# Patient Record
Sex: Female | Born: 1980 | Race: White | Hispanic: No | Marital: Married | Smoking: Never smoker
Health system: Southern US, Community
[De-identification: ages and names within clinical notes are randomized; demographics above are authoritative.]

## PROBLEM LIST (undated history)

## (undated) DIAGNOSIS — K219 Gastro-esophageal reflux disease without esophagitis: Secondary | ICD-10-CM

## (undated) DIAGNOSIS — E079 Disorder of thyroid, unspecified: Secondary | ICD-10-CM

## (undated) DIAGNOSIS — F419 Anxiety disorder, unspecified: Secondary | ICD-10-CM

## (undated) DIAGNOSIS — T7840XA Allergy, unspecified, initial encounter: Secondary | ICD-10-CM

## (undated) DIAGNOSIS — K589 Irritable bowel syndrome without diarrhea: Secondary | ICD-10-CM

## (undated) HISTORY — DX: Allergy, unspecified, initial encounter: T78.40XA

## (undated) HISTORY — PX: LASIK: SHX215

## (undated) HISTORY — DX: Disorder of thyroid, unspecified: E07.9

## (undated) HISTORY — PX: OTHER SURGICAL HISTORY: SHX169

## (undated) HISTORY — PX: ABLATION: SHX5711

## (undated) HISTORY — DX: Anxiety disorder, unspecified: F41.9

## (undated) HISTORY — PX: MOUTH SURGERY: SHX715

## (undated) HISTORY — PX: WISDOM TOOTH EXTRACTION: SHX21

## (undated) HISTORY — DX: Gastro-esophageal reflux disease without esophagitis: K21.9

---

## 2011-08-01 ENCOUNTER — Ambulatory Visit (INDEPENDENT_AMBULATORY_CARE_PROVIDER_SITE_OTHER): Payer: Self-pay

## 2011-08-01 DIAGNOSIS — R197 Diarrhea, unspecified: Secondary | ICD-10-CM

## 2011-08-15 ENCOUNTER — Encounter: Payer: Self-pay | Admitting: Gastroenterology

## 2011-08-15 ENCOUNTER — Ambulatory Visit (AMBULATORY_SURGERY_CENTER): Payer: Self-pay | Admitting: Gastroenterology

## 2011-08-15 VITALS — BP 131/65 | HR 85 | Temp 99.6°F | Resp 15 | Ht 64.0 in | Wt 204.0 lb

## 2011-08-15 DIAGNOSIS — K589 Irritable bowel syndrome without diarrhea: Secondary | ICD-10-CM

## 2011-08-15 DIAGNOSIS — Z1211 Encounter for screening for malignant neoplasm of colon: Secondary | ICD-10-CM

## 2011-08-15 MED ORDER — SODIUM CHLORIDE 0.9 % IV SOLN: 500.0000 mL | INTRAVENOUS | Status: DC

## 2011-08-15 NOTE — Patient Instructions (Signed)
See your discharge instructions.  If you have any questions or comments, please give [place] a call at [phone].  Thank you for choosing [place] for your medical needs.

## 2011-08-19 ENCOUNTER — Telehealth: Payer: Self-pay | Admitting: *Deleted

## 2011-08-19 NOTE — Telephone Encounter (Signed)

## 2011-08-25 ENCOUNTER — Ambulatory Visit (INDEPENDENT_AMBULATORY_CARE_PROVIDER_SITE_OTHER): Payer: Self-pay

## 2011-08-25 DIAGNOSIS — R197 Diarrhea, unspecified: Secondary | ICD-10-CM

## 2012-02-29 ENCOUNTER — Encounter (HOSPITAL_BASED_OUTPATIENT_CLINIC_OR_DEPARTMENT_OTHER): Payer: Self-pay | Admitting: *Deleted

## 2012-02-29 ENCOUNTER — Emergency Department (INDEPENDENT_AMBULATORY_CARE_PROVIDER_SITE_OTHER): Payer: BC Managed Care – PPO

## 2012-02-29 ENCOUNTER — Emergency Department (HOSPITAL_BASED_OUTPATIENT_CLINIC_OR_DEPARTMENT_OTHER)
Admission: EM | Admit: 2012-02-29 | Discharge: 2012-02-29 | Disposition: A | Discharge status: Home / Self Care | Payer: BC Managed Care – PPO | Attending: Emergency Medicine | Admitting: Emergency Medicine

## 2012-02-29 DIAGNOSIS — N390 Urinary tract infection, site not specified: Secondary | ICD-10-CM | POA: Insufficient documentation

## 2012-02-29 DIAGNOSIS — R000 Tachycardia, unspecified: Secondary | ICD-10-CM | POA: Insufficient documentation

## 2012-02-29 DIAGNOSIS — R109 Unspecified abdominal pain: Secondary | ICD-10-CM | POA: Insufficient documentation

## 2012-02-29 DIAGNOSIS — R110 Nausea: Secondary | ICD-10-CM | POA: Insufficient documentation

## 2012-02-29 DIAGNOSIS — R6883 Chills (without fever): Secondary | ICD-10-CM | POA: Insufficient documentation

## 2012-02-29 DIAGNOSIS — R350 Frequency of micturition: Secondary | ICD-10-CM | POA: Insufficient documentation

## 2012-02-29 DIAGNOSIS — R10813 Right lower quadrant abdominal tenderness: Secondary | ICD-10-CM | POA: Insufficient documentation

## 2012-02-29 DIAGNOSIS — R918 Other nonspecific abnormal finding of lung field: Secondary | ICD-10-CM

## 2012-02-29 HISTORY — DX: Irritable bowel syndrome, unspecified: K58.9

## 2012-02-29 LAB — URINE MICROSCOPIC-ADD ON

## 2012-02-29 LAB — COMPREHENSIVE METABOLIC PANEL
BUN: 15 mg/dL (ref 6–23)
CO2: 25 mEq/L (ref 19–32)
Chloride: 101 mEq/L (ref 96–112)
Creatinine, Ser: 0.9 mg/dL (ref 0.50–1.10)
GFR calc Af Amer: >90 mL/min (ref >90)
GFR calc non Af Amer: 84 mL/min — ABNORMAL LOW (ref >90)
Glucose, Bld: 115 mg/dL — ABNORMAL HIGH (ref 70–99)
Total Bilirubin: 0.3 mg/dL (ref 0.3–1.2)

## 2012-02-29 LAB — CBC
HCT: 35.6 % — ABNORMAL LOW (ref 36.0–46.0)
Hemoglobin: 12.2 g/dL (ref 12.0–15.0)
MCHC: 34.3 g/dL (ref 30.0–36.0)

## 2012-02-29 LAB — LIPASE, BLOOD: Lipase: 26 U/L (ref 11–59)

## 2012-02-29 LAB — URINALYSIS, ROUTINE W REFLEX MICROSCOPIC
Glucose, UA: NEGATIVE mg/dL
Ketones, ur: NEGATIVE mg/dL
Nitrite: NEGATIVE
Protein, ur: 30 mg/dL — AB

## 2012-02-29 LAB — DIFFERENTIAL
Basophils Relative: 0 % (ref 0–1)
Monocytes Absolute: 2.1 10*3/uL — ABNORMAL HIGH (ref 0.1–1.0)
Monocytes Relative: 10 % (ref 3–12)
Neutro Abs: 16.5 10*3/uL — ABNORMAL HIGH (ref 1.7–7.7)

## 2012-02-29 MED ORDER — SODIUM CHLORIDE 0.9 % IV BOLUS (SEPSIS)
1000.0000 mL | Freq: Once | INTRAVENOUS | Status: AC
  Administered 2012-02-29: 1000 mL via INTRAVENOUS

## 2012-02-29 MED ORDER — CEPHALEXIN 500 MG PO CAPS: 500.0000 mg | ORAL_CAPSULE | Freq: Four times a day (QID) | ORAL | Status: AC

## 2012-02-29 MED ORDER — MORPHINE SULFATE 4 MG/ML IJ SOLN
4.0000 mg | Freq: Once | INTRAMUSCULAR | Status: AC
  Administered 2012-02-29: 4 mg via INTRAVENOUS
  Filled 2012-02-29: qty 1

## 2012-02-29 MED ORDER — IOHEXOL 300 MG/ML  SOLN
40.0000 mL | Freq: Once | INTRAMUSCULAR | Status: AC | PRN
  Administered 2012-02-29: 40 mL via ORAL

## 2012-02-29 MED ORDER — IOHEXOL 300 MG/ML  SOLN
100.0000 mL | Freq: Once | INTRAMUSCULAR | Status: AC | PRN
  Administered 2012-02-29: 100 mL via INTRAVENOUS

## 2012-02-29 MED ORDER — ONDANSETRON HCL 4 MG/2ML IJ SOLN
4.0000 mg | Freq: Once | INTRAMUSCULAR | Status: AC
  Administered 2012-02-29: 4 mg via INTRAVENOUS
  Filled 2012-02-29: qty 2

## 2012-02-29 MED ORDER — ACETAMINOPHEN 325 MG PO TABS
650.0000 mg | ORAL_TABLET | Freq: Once | ORAL | Status: AC
  Administered 2012-02-29: 650 mg via ORAL
  Filled 2012-02-29: qty 2

## 2012-02-29 NOTE — ED Notes (Signed)
Pt with painful urination and lower abd pain, nausea

## 2012-02-29 NOTE — ED Provider Notes (Signed)
History     CSN: [id number]  Arrival date & time 02/29/12  4098   First MD Initiated Contact with Patient 02/29/12 0207      Chief Complaint  Patient presents with  . Abdominal Pain  . painful urination     (Consider location/radiation/quality/duration/timing/severity/associated sxs/prior treatment) HPI Patient is a 31 year old female who presents today complaining of acute onset of right lower quadrant abdominal pain as well as increased urinary frequency this afternoon. Patient had some nausea but no vomiting. She has felt hot and cold but knows of no objective fevers. She denies any recent chest pain, cough, or shortness of breath. She denies any constipation or diarrhea. She thought at first that her pain might be gas but it is not moving it has not gone away. The pain is an 8/10. Patient denies any dysuria or vaginal discharge. She has no history of urinary tract infections. Patient does have a history of IBS but says this feels very different.There are no other associated or modifying factors.  Past Medical History  Diagnosis Date  . Anxiety   . Allergy     seasonal allergies  . Irritable bowel     Past Surgical History  Procedure Date  . Lasik   . Partial detatched retina   . Mouth surgery   . Wisdom tooth extraction     History reviewed. No pertinent family history.  History  Substance Use Topics  . Smoking status: Never Smoker   . Smokeless tobacco: Never Used  . Alcohol Use: Yes     occasionally    OB History    Grav Para Term Preterm Abortions TAB SAB Ect Mult Living                  Review of Systems  Constitutional: Positive for chills.  HENT: Negative.   Eyes: Negative.   Respiratory: Negative.   Cardiovascular: Negative.   Gastrointestinal: Positive for nausea and abdominal pain.  Genitourinary: Positive for frequency.  Musculoskeletal: Negative.   Neurological: Negative.   Hematological: Negative.   Psychiatric/Behavioral: Negative.   All  other systems reviewed and are negative.    Allergies  Hydrocodone  Home Medications   Current Outpatient Rx  Name Route Sig Dispense Refill  . CEPHALEXIN 500 MG PO CAPS Oral Take 1 capsule (500 mg total) by mouth 4 (four) times daily. 40 capsule 0  . CRYSELLE-28 PO Oral Take by mouth.      . TOPIRAMATE 25 MG PO CPSP Oral Take 25 mg by mouth 2 (two) times daily.      Marland Kitchen ZOLPIDEM TARTRATE ER 6.25 MG PO TBCR Oral Take 6.25 mg by mouth at bedtime as needed.        BP 101/54  Pulse 94  Temp(Src) 98.8 F (37.1 C) (Oral)  Resp 16  SpO2 99%  LMP 12/27/2011  Physical Exam  Nursing note and vitals reviewed. GEN: Well-developed, well-nourished female in no distress, uncomfortable HEENT: Atraumatic, normocephalic. Oropharynx clear without erythema EYES: PERRLA BL, no scleral icterus. NECK: Trachea midline, no meningismus CV: regular rate and rhythm. No murmurs, rubs, or gallops PULM: No respiratory distress.  No crackles, wheezes, or rales. GI: soft, tender to palpation in the right lower quadrant. No guarding, rebound. + bowel sounds  GU: deferred Neuro: cranial nerves 2-12 intact, no abnormalities of strength or sensation, A and O x 3 MSK: Patient moves all 4 extremities symmetrically, no deformity, edema, or injury noted Skin: No rashes petechiae, purpura, or jaundice Psych: no  abnormality of mood   ED Course  Procedures (including critical care time)  Labs Reviewed  URINALYSIS, ROUTINE W REFLEX MICROSCOPIC - Abnormal; Notable for the following:    APPearance CLOUDY (*)    Hgb urine dipstick LARGE (*)    Protein, ur 30 (*)    Leukocytes, UA LARGE (*)    All other components within normal limits  URINE MICROSCOPIC-ADD ON - Abnormal; Notable for the following:    Bacteria, UA MANY (*)    All other components within normal limits  CBC - Abnormal; Notable for the following:    WBC 20.2 (*)    HCT 35.6 (*)    All other components within normal limits  DIFFERENTIAL -  Abnormal; Notable for the following:    Neutrophils Relative 81 (*)    Neutro Abs 16.5 (*)    Lymphocytes Relative 8 (*)    Monocytes Absolute 2.1 (*)    All other components within normal limits  COMPREHENSIVE METABOLIC PANEL - Abnormal; Notable for the following:    Glucose, Bld 115 (*)    GFR calc non Af Amer 84 (*)    All other components within normal limits  PREGNANCY, URINE  LIPASE, BLOOD  URINE CULTURE   Ct Abdomen Pelvis W Contrast  02/29/2012  *RADIOLOGY REPORT*  Clinical Data: Lower abdominal pain and nausea.  CT ABDOMEN AND PELVIS WITH CONTRAST  Technique:  Multidetector CT imaging of the abdomen and pelvis was performed following the standard protocol during bolus administration of intravenous contrast.  Contrast: 100 mL of Omnipaque 300 IV contrast  Comparison: None.  Findings: Mild focal left basilar airspace opacity raises concern for mild pneumonia.  The liver and spleen are unremarkable in appearance.  The gallbladder is within normal limits.  The pancreas and adrenal glands are unremarkable.  The kidneys are unremarkable in appearance.  There is no evidence of hydronephrosis.  No renal or ureteral stones are seen.  No perinephric stranding is appreciated.  Mild stranding is noted along the course of the right ureter, with question of mild ureteral wall enhancement.  This could reflect mild right-sided ureteritis.  No definite pyelonephritis is characterized.  No free fluid is identified.  The small bowel is unremarkable in appearance.  The stomach is within normal limits.  No acute vascular abnormalities are seen.  The appendix is normal in caliber, without evidence for appendicitis.  The colon is partially filled with stool and is unremarkable in appearance.  The bladder is mildly distended and within normal limits.  The uterus is unremarkable in appearance.  The ovaries are relatively symmetric; no suspicious adnexal masses are seen.  Borderline prominent bilateral inguinal nodes  are noted, measuring up to 1.1 cm in short axis.  No acute osseous abnormalities are identified.  IMPRESSION:  1.  Mild inflammation along the course of the right ureter may reflect mild right-sided ureteritis; no definite evidence of pyelonephritis. 2.  Mild focal left basilar airspace opacity raises concern for mild pneumonia. 3.  Borderline prominent bilateral inguinal nodes, measuring up to 1.1 cm in short axis.  Original Report Authenticated By: Tonia Ghent, M.D.     1. UTI (urinary tract infection)       MDM  Patient was evaluated by myself. Based on presentation the patient had a urinalysis and urine pregnancy ordered prior to my valuation. Patient did have evidence of UTI on this. Her urine pregnancy test is negative. On physical exam patient did have right lower quadrant tenderness and she has  not had an appendectomy in the past. Laboratory workup for abdominal pain as well as a CT of abdomen and pelvis were performed. Patient had also been tachycardic and on my physical a temperature had been repeated and was 100.1. Patient was given Zofran, IV fluids, Tylenol, and Rocephin. A urine culture was sent. CT of the abdomen and pelvis did show inflammation of the ureter without frank pyelonephritis. No appendicitis was seen. Patient was feeling better after treatment. She will be discharged home in good condition with 10 days of Keflex. Patient is to followup with her regular doctor as needed. Patient declined any pain or nausea medications when these were offered.        Cyndra Numbers, MD 02/29/12 [phone]

## 2012-02-29 NOTE — Discharge Instructions (Signed)

## 2012-03-02 LAB — URINE CULTURE

## 2012-03-03 NOTE — ED Notes (Signed)
+   urine Treated per protocol MD; Sensitive to same 

## 2013-04-20 IMAGING — CT CT ABD-PELV W/ CM
2 of 4 series · 17 of 46 positions shown, 19 images · IV contrast (APPLIED)
Comparison: None.

CLINICAL DATA: Lower abdominal pain and nausea.

CT ABDOMEN AND PELVIS WITH CONTRAST
TECHNIQUE: Multidetector CT imaging of the abdomen and pelvis was
performed following the standard protocol during bolus
administration of intravenous contrast.
Contrast: 100 mL of Omnipaque 300 IV contrast

[Series 2: abd/pelvis 5.0 b31f · axial · 0.82mm/px · z∈[-445,-35]mm · 14 of 90 slices shown, 16 images]
[im 4/90  soft-tissue]
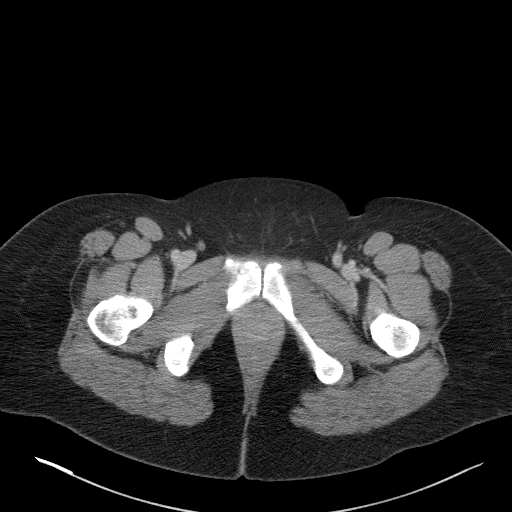
[im 4/90  bone]
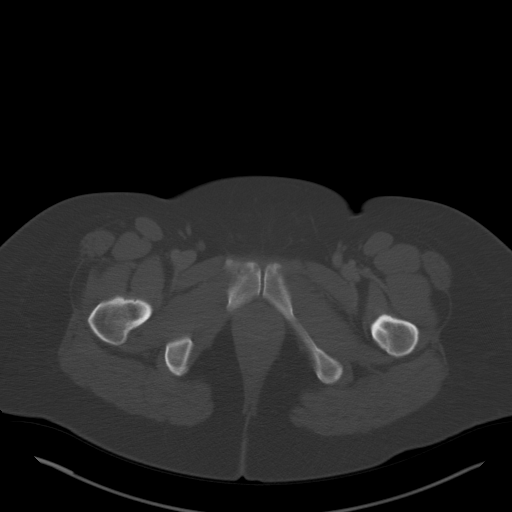
[im 11/90  soft-tissue]
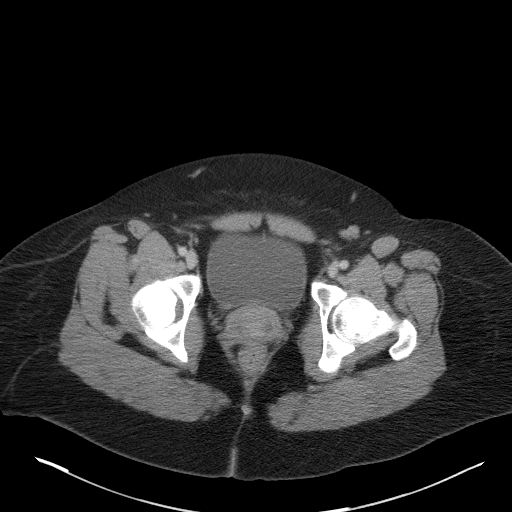
[im 18/90  soft-tissue]
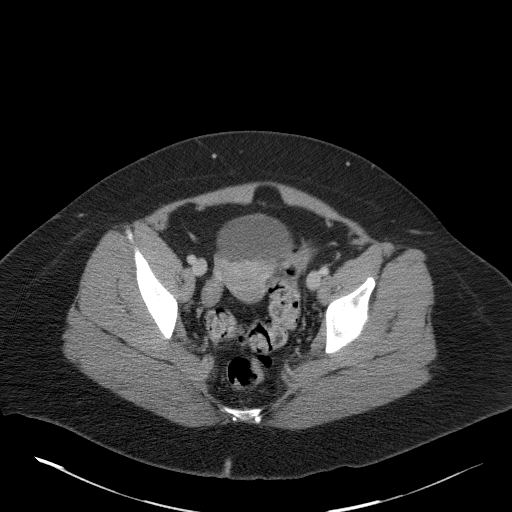
[im 25/90  soft-tissue]
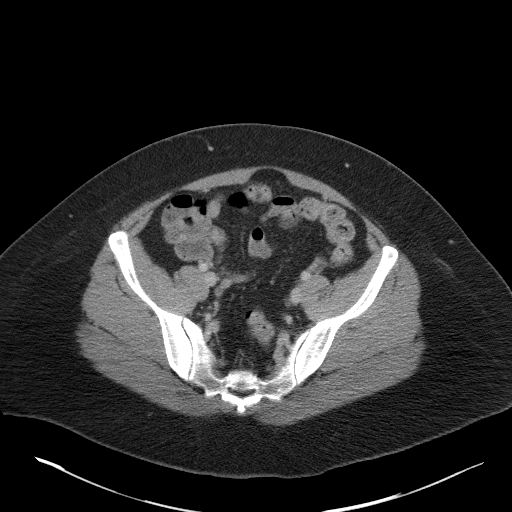
[im 29/90  soft-tissue]
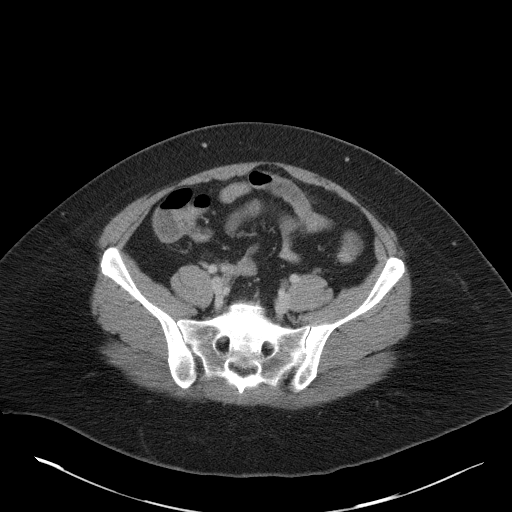
[im 36/90  soft-tissue]
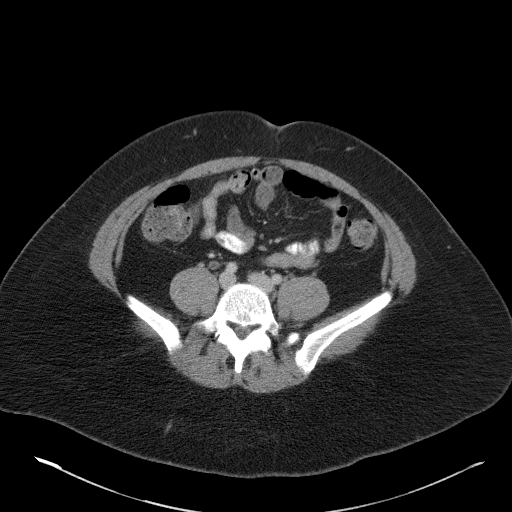
[im 43/90  soft-tissue]
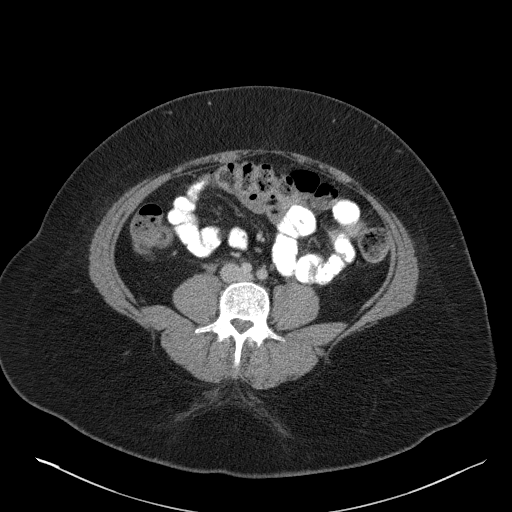
[im 47/90  soft-tissue]
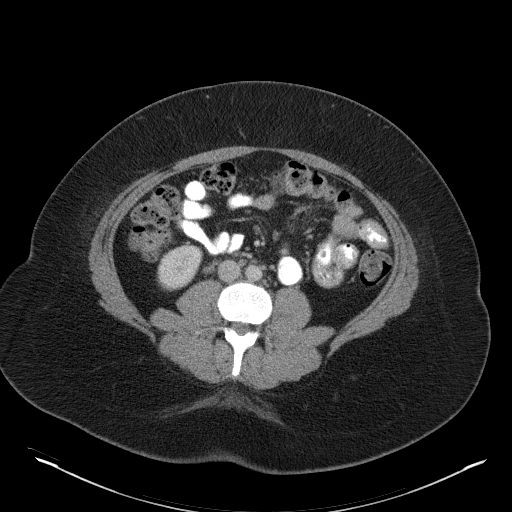
[im 54/90  soft-tissue]
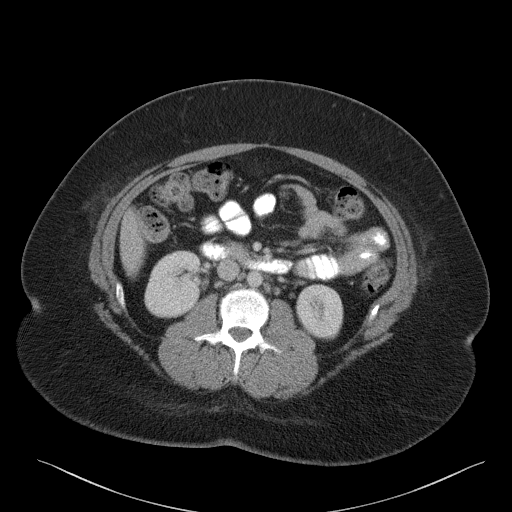
[im 54/90  bone]
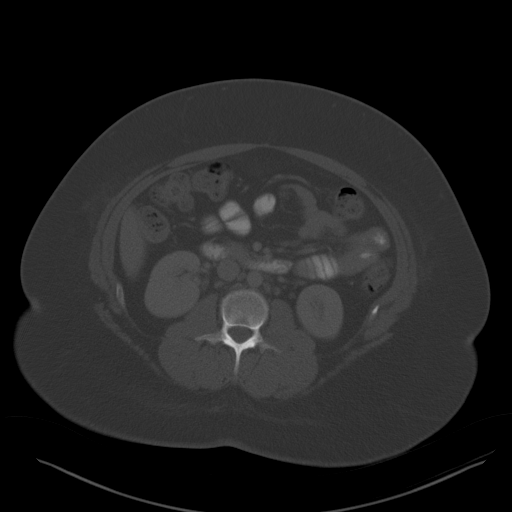
[im 61/90  soft-tissue]
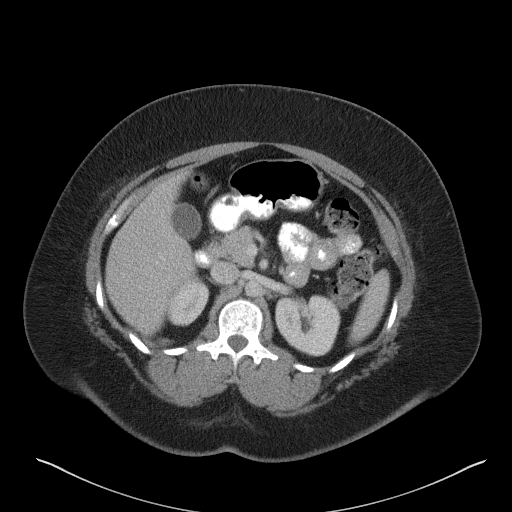
[im 68/90  soft-tissue]
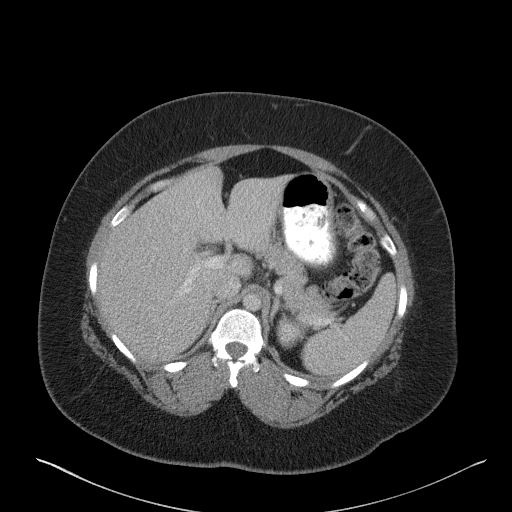
[im 72/90  soft-tissue]
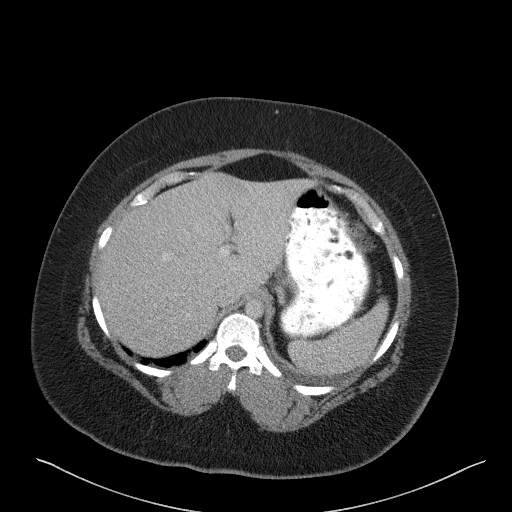
[im 79/90  soft-tissue]
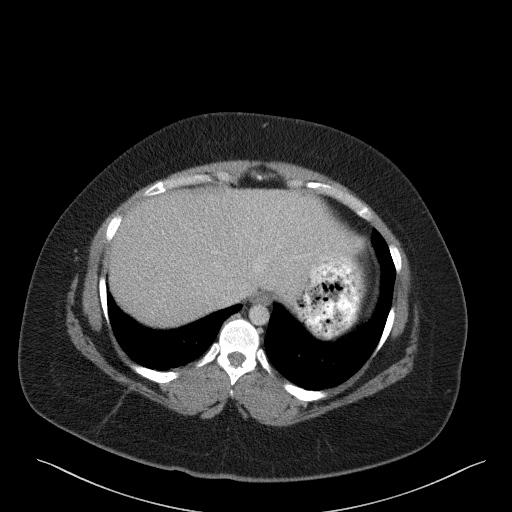
[im 86/90  soft-tissue]
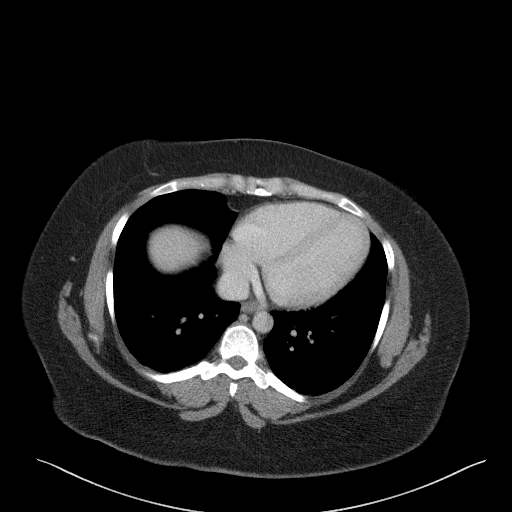

[Series 5: abd/pelvis 3.0 coronal · coronal · 0.99mm/px · 3 of 100 slices shown]
[im 34/100  soft-tissue]
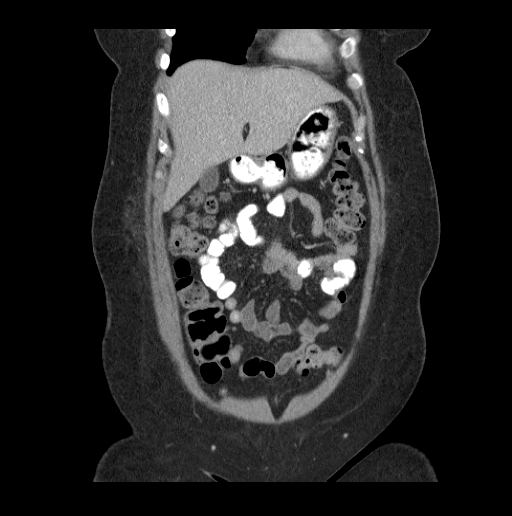
[im 45/100  soft-tissue]
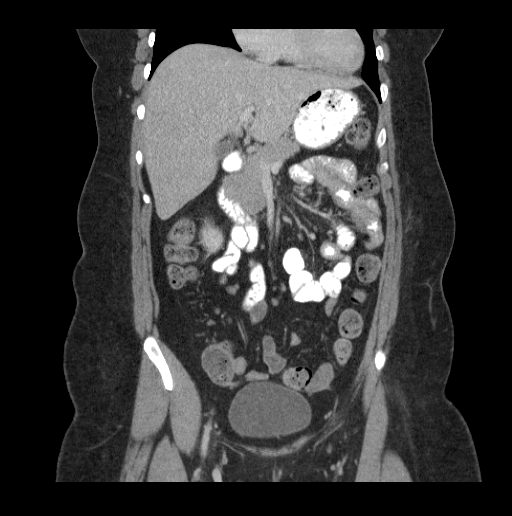
[im 56/100  soft-tissue]
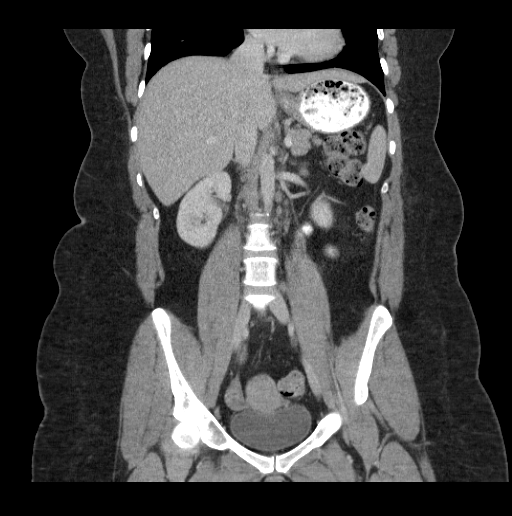

[17 of 46 positions shown; findings below may reference images not displayed]

FINDINGS: Mild focal left basilar airspace opacity raises concern
for mild pneumonia.

The liver and spleen are unremarkable in appearance.  The
gallbladder is within normal limits.  The pancreas and adrenal
glands are unremarkable.

The kidneys are unremarkable in appearance.  There is no evidence
of hydronephrosis.  No renal or ureteral stones are seen.  No
perinephric stranding is appreciated.

Mild stranding is noted along the course of the right ureter, with
question of mild ureteral wall enhancement.  This could reflect
mild right-sided ureteritis.  No definite pyelonephritis is
characterized.

No free fluid is identified.  The small bowel is unremarkable in
appearance.  The stomach is within normal limits.  No acute
vascular abnormalities are seen.

The appendix is normal in caliber, without evidence for
appendicitis.  The colon is partially filled with stool and is
unremarkable in appearance.

The bladder is mildly distended and within normal limits.  The
uterus is unremarkable in appearance.  The ovaries are relatively
symmetric; no suspicious adnexal masses are seen.  Borderline
prominent bilateral inguinal nodes are noted, measuring up to
cm in short axis.

No acute osseous abnormalities are identified.
IMPRESSION: 1.  Mild inflammation along the course of the right ureter may
reflect mild right-sided ureteritis; no definite evidence of
pyelonephritis.
2.  Mild focal left basilar airspace opacity raises concern for
mild pneumonia.
3.  Borderline prominent bilateral inguinal nodes, measuring up to
1.1 cm in short axis.

## 2016-09-18 DIAGNOSIS — G43009 Migraine without aura, not intractable, without status migrainosus: Secondary | ICD-10-CM | POA: Insufficient documentation

## 2018-04-14 DIAGNOSIS — G4700 Insomnia, unspecified: Secondary | ICD-10-CM | POA: Insufficient documentation

## 2020-06-14 DIAGNOSIS — F411 Generalized anxiety disorder: Secondary | ICD-10-CM | POA: Insufficient documentation

## 2021-09-30 ENCOUNTER — Emergency Department (HOSPITAL_BASED_OUTPATIENT_CLINIC_OR_DEPARTMENT_OTHER): Payer: No Typology Code available for payment source

## 2021-09-30 ENCOUNTER — Encounter (HOSPITAL_BASED_OUTPATIENT_CLINIC_OR_DEPARTMENT_OTHER): Payer: Self-pay

## 2021-09-30 ENCOUNTER — Other Ambulatory Visit: Payer: Self-pay

## 2021-09-30 ENCOUNTER — Emergency Department (HOSPITAL_BASED_OUTPATIENT_CLINIC_OR_DEPARTMENT_OTHER)
Admission: EM | Admit: 2021-09-30 | Discharge: 2021-09-30 | Disposition: A | Discharge status: Home / Self Care | Payer: No Typology Code available for payment source | Attending: Emergency Medicine | Admitting: Emergency Medicine

## 2021-09-30 DIAGNOSIS — R1013 Epigastric pain: Secondary | ICD-10-CM | POA: Insufficient documentation

## 2021-09-30 DIAGNOSIS — R1011 Right upper quadrant pain: Secondary | ICD-10-CM | POA: Diagnosis not present

## 2021-09-30 LAB — TROPONIN I (HIGH SENSITIVITY): Troponin I (High Sensitivity): <2 ng/L (ref <18)

## 2021-09-30 LAB — BASIC METABOLIC PANEL
Anion gap: 9 (ref 5–15)
BUN: 15 mg/dL (ref 6–20)
CO2: 27 mmol/L (ref 22–32)
Calcium: 9.5 mg/dL (ref 8.9–10.3)
Chloride: 99 mmol/L (ref 98–111)
Creatinine, Ser: 0.76 mg/dL (ref 0.44–1.00)
GFR, Estimated: >60 mL/min (ref >60)
Glucose, Bld: 111 mg/dL — ABNORMAL HIGH (ref 70–99)
Potassium: 3.7 mmol/L (ref 3.5–5.1)
Sodium: 135 mmol/L (ref 135–145)

## 2021-09-30 LAB — HEPATIC FUNCTION PANEL
ALT: 20 U/L (ref 0–44)
AST: 19 U/L (ref 15–41)
Albumin: 4 g/dL (ref 3.5–5.0)
Alkaline Phosphatase: 72 U/L (ref 38–126)
Bilirubin, Direct: <0.1 mg/dL (ref 0.0–0.2)
Total Bilirubin: 0.2 mg/dL — ABNORMAL LOW (ref 0.3–1.2)
Total Protein: 7.5 g/dL (ref 6.5–8.1)

## 2021-09-30 LAB — URINALYSIS, ROUTINE W REFLEX MICROSCOPIC
Bilirubin Urine: NEGATIVE
Glucose, UA: NEGATIVE mg/dL
Hgb urine dipstick: NEGATIVE
Ketones, ur: NEGATIVE mg/dL
Leukocytes,Ua: NEGATIVE
Nitrite: NEGATIVE
Protein, ur: NEGATIVE mg/dL
Specific Gravity, Urine: 1.015 (ref 1.005–1.030)
pH: 6.5 (ref 5.0–8.0)

## 2021-09-30 LAB — CBC
HCT: 39.4 % (ref 36.0–46.0)
Hemoglobin: 13.3 g/dL (ref 12.0–15.0)
MCH: 30.6 pg (ref 26.0–34.0)
MCHC: 33.8 g/dL (ref 30.0–36.0)
MCV: 90.6 fL (ref 80.0–100.0)
Platelets: 394 10*3/uL (ref 150–400)
RBC: 4.35 MIL/uL (ref 3.87–5.11)
RDW: 13.2 % (ref 11.5–15.5)
WBC: 10.9 10*3/uL — ABNORMAL HIGH (ref 4.0–10.5)
nRBC: 0 % (ref 0.0–0.2)

## 2021-09-30 LAB — LIPASE, BLOOD: Lipase: 25 U/L (ref 11–51)

## 2021-09-30 MED ORDER — PANTOPRAZOLE SODIUM 40 MG PO TBEC
40.0000 mg | DELAYED_RELEASE_TABLET | Freq: Once | ORAL | Status: AC
  Administered 2021-09-30: 40 mg via ORAL
  Filled 2021-09-30: qty 1

## 2021-09-30 MED ORDER — ALUM & MAG HYDROXIDE-SIMETH 200-200-20 MG/5ML PO SUSP
30.0000 mL | Freq: Once | ORAL | Status: AC
  Administered 2021-09-30: 30 mL via ORAL
  Filled 2021-09-30: qty 30

## 2021-09-30 MED ORDER — LIDOCAINE VISCOUS HCL 2 % MT SOLN
15.0000 mL | Freq: Once | OROMUCOSAL | Status: AC
  Administered 2021-09-30: 15 mL via ORAL
  Filled 2021-09-30: qty 15

## 2021-09-30 MED ORDER — PANTOPRAZOLE SODIUM 40 MG PO TBEC: 40.0000 mg | DELAYED_RELEASE_TABLET | Freq: Every day | ORAL | 0 refills | Status: DC

## 2021-09-30 MED ORDER — ACETAMINOPHEN 325 MG PO TABS
650.0000 mg | ORAL_TABLET | Freq: Once | ORAL | Status: AC
  Administered 2021-09-30: 650 mg via ORAL
  Filled 2021-09-30: qty 2

## 2021-09-30 NOTE — ED Triage Notes (Signed)
Pt started having indigestion like pain yesterday, pain has continued since. States burps or passes gas and improves for a second then returns.

## 2021-09-30 NOTE — Discharge Instructions (Signed)
Take Protonix as prescribed.  Recommend discontinue any NSAID medications (Goody powder, ibuprofen, naproxen). Follow-up with GI, call tomorrow to schedule an appointment.  Return to the emergency room for worsening or concerning symptoms.

## 2021-09-30 NOTE — ED Notes (Signed)
Pt placed in a gown and hooked up to the monitor with 5 lead, BP cuff and pulse ox 

## 2021-09-30 NOTE — ED Provider Notes (Signed)
[facility] EMERGENCY DEPARTMENT Provider Note   CSN: [id number] Arrival date & time: 09/30/21  1659     History Chief Complaint  Patient presents with   Chest Pain    Carrie Sparks is a 40 y.o. female.  40 year old female with no significant past medical history presents with complaint of epigastric abdominal pain onset yesterday described as a sharp and burning pain.  Pain improved somewhat with belching however resumes.  Patient is taking Tums without improvement in her symptoms.  Pain does not radiate.  Pain is worse with eating, has somewhat improved since she has gone several hours now without eating.  Reports taking a lot of ibuprofen and is under a lot of stress at work currently.  Prior abdominal surgery includes C-section.      Past Medical History:  Diagnosis Date   Allergy    seasonal allergies   Anxiety    Irritable bowel     There are no problems to display for this patient.   Past Surgical History:  Procedure Laterality Date   LASIK     MOUTH SURGERY     Partial detatched retina     WISDOM TOOTH EXTRACTION       OB History   No obstetric history on file.     History reviewed. No pertinent family history.  Social History   Tobacco Use   Smoking status: Never   Smokeless tobacco: Never  Substance Use Topics   Alcohol use: Yes    Comment: occasionally   Drug use: No    Home Medications Prior to Admission medications   Medication Sig Start Date End Date Taking? Authorizing Provider  pantoprazole (PROTONIX) 40 MG tablet Take 1 tablet (40 mg total) by mouth daily. 09/30/21 10/30/21 Yes Jeannie Fend, PA-C  Norgestrel-Ethinyl Estradiol (CRYSELLE-28 PO) Take by mouth.      [provider]  topiramate (TOPAMAX) 25 MG capsule Take 25 mg by mouth 2 (two) times daily.      [provider]  zolpidem (AMBIEN CR) 6.25 MG CR tablet Take 6.25 mg by mouth at bedtime as needed.      [provider]     Allergies    Hydrocodone  Review of Systems   Review of Systems  Constitutional:  Positive for chills. Negative for fever.  Respiratory:  Negative for shortness of breath.   Cardiovascular:  Negative for chest pain.  Gastrointestinal:  Positive for abdominal pain and nausea. Negative for blood in stool, constipation, diarrhea and vomiting.  Genitourinary:  Negative for dysuria.  Musculoskeletal:  Negative for back pain.  Skin:  Negative for rash and wound.  Allergic/Immunologic: Negative for immunocompromised state.  Neurological:  Negative for weakness.  Hematological:  Negative for adenopathy.  All other systems reviewed and are negative.  Physical Exam Updated Vital Signs BP 115/76 (BP Location: Right Arm)   Pulse 84   Temp 98.2 F (36.8 C) (Oral)   Resp 18   Ht 5\' 4"  (1.626 m)   Wt 99.8 kg   LMP 09/23/2021 (Approximate)   SpO2 97%   BMI 37.76 kg/m   Physical Exam Vitals and nursing note reviewed.  Constitutional:      General: She is not in acute distress.    Appearance: She is well-developed. She is not diaphoretic.  HENT:     Head: Normocephalic and atraumatic.  Cardiovascular:     Rate and Rhythm: Normal rate and regular rhythm.     Heart sounds: Normal heart  sounds. No murmur heard. Pulmonary:     Effort: Pulmonary effort is normal.     Breath sounds: Normal breath sounds.  Chest:     Chest wall: No tenderness.  Abdominal:     General: There is abdominal bruit.     Palpations: Abdomen is soft.     Tenderness: There is abdominal tenderness in the right upper quadrant, epigastric area and left upper quadrant. Negative signs include Massimiliano Rohleder's sign.  Skin:    General: Skin is warm and dry.     Findings: No erythema or rash.  Neurological:     Mental Status: She is alert and oriented to person, place, and time.  Psychiatric:        Behavior: Behavior normal.    ED Results / Procedures / Treatments   Labs (all labs ordered are listed, but only  abnormal results are displayed) Labs Reviewed  BASIC METABOLIC PANEL - Abnormal; Notable for the following components:      Result Value   Glucose, Bld 111 (*)    All other components within normal limits  CBC - Abnormal; Notable for the following components:   WBC 10.9 (*)    All other components within normal limits  HEPATIC FUNCTION PANEL - Abnormal; Notable for the following components:   Total Bilirubin 0.2 (*)    All other components within normal limits  URINALYSIS, ROUTINE W REFLEX MICROSCOPIC  LIPASE, BLOOD  TROPONIN I (HIGH SENSITIVITY)    EKG EKG Interpretation  Date/Time:  Monday September 30 2021 17:07:46 EDT Ventricular Rate:  96 PR Interval:  146 QRS Duration: 80 QT Interval:  364 QTC Calculation: 459 R Axis:   76 Text Interpretation: Normal sinus rhythm Normal ECG No previous ECGs available Confirmed by Alvira Monday ([id number]) on 09/30/2021 7:47:46 PM  Radiology DG Chest 2 View  Result Date: 09/30/2021 CLINICAL DATA:  Chest pain EXAM: CHEST - 2 VIEW COMPARISON:  11/24/2016 FINDINGS: Heart and mediastinal contours are within normal limits. No focal opacities or effusions. No acute bony abnormality. IMPRESSION: No active cardiopulmonary disease. Electronically Signed   By: Charlett Nose M.D.   On: 09/30/2021 17:32   US Abdomen Limited RUQ (LIVER/GB)  Result Date: 09/30/2021 CLINICAL DATA:  Right upper quadrant pain EXAM: ULTRASOUND ABDOMEN LIMITED RIGHT UPPER QUADRANT COMPARISON:  CT 02/29/2012 FINDINGS: Gallbladder: No gallstones or wall thickening visualized. No sonographic Toye Rouillard sign noted by sonographer. Common bile duct: Diameter: 4.8 mm Liver: No focal lesion identified. Within normal limits in parenchymal echogenicity. Portal vein is patent on color Doppler imaging with normal direction of blood flow towards the liver. Other: None. IMPRESSION: Negative examination Electronically Signed   By: Jasmine Pang M.D.   On: 09/30/2021 22:03     Procedures Procedures   Medications Ordered in ED Medications  acetaminophen (TYLENOL) tablet 650 mg (has no administration in time range)  pantoprazole (PROTONIX) EC tablet 40 mg (has no administration in time range)  alum & mag hydroxide-simeth (MAALOX/MYLANTA) 200-200-20 MG/5ML suspension 30 mL (has no administration in time range)    And  lidocaine (XYLOCAINE) 2 % viscous mouth solution 15 mL (has no administration in time range)    ED Course  I have reviewed the triage vital signs and the nursing notes.  Pertinent labs & imaging results that were available during my care of the patient were reviewed by me and considered in my medical decision making (see chart for details).  Clinical Course as of 09/30/21 2235  Mon Sep 30, 2021  2234  40 year old female presents with epigastric abdominal pain as above.  At time of exam, her pain has improved, she has mild tenderness in the epigastric and generalized upper abdomen.  Negative Zianne Schubring sign.  Patient was registered and has a chest pain complaint, EKG shows normal sinus rhythm, no ischemic changes.  Her troponin is less than 2.  Symptoms appear to be more related to upper abdomen, added on hepatic function which is unremarkable.  Lipase is normal.  CBC with mild acidosis of 10.9, nonspecific.  Urinalysis is unremarkable and basic metabolic is without significant findings.  Patient is mildly tachycardic on arrival however afebrile at 98.2 with room air O2 sats 95%.  By time of discharge, heart rate is normal at 84.  Right upper quadrant ultrasound does not show evidence of acute cholecystitis.  Chest x-ray is unremarkable. With history of NSAID use and increased stress, suspect patient may have peptic ulcer disease.  She is started on Protonix with referral to GI for further work-up with return to ER precautions. Advised to discontinue NSAIDs, given peptic ulcer diet. [LM]    Clinical Course User Index [LM] Alden Hipp   MDM  Rules/Calculators/A&P                            Final Clinical Impression(s) / ED Diagnoses Final diagnoses:  Right upper quadrant abdominal pain  Epigastric abdominal pain    Rx / DC Orders ED Discharge Orders          Ordered    pantoprazole (PROTONIX) 40 MG tablet  Daily        09/30/21 2229             Jeannie Fend, PA-C 09/30/21 2235    Alvira Monday, MD 10/02/21 2224

## 2022-01-15 LAB — HM PAP SMEAR: HM Pap smear: NORMAL

## 2022-06-17 ENCOUNTER — Emergency Department (HOSPITAL_BASED_OUTPATIENT_CLINIC_OR_DEPARTMENT_OTHER)
Admission: EM | Admit: 2022-06-17 | Discharge: 2022-06-17 | Disposition: A | Discharge status: Home / Self Care | Payer: Managed Care, Other (non HMO) | Attending: Emergency Medicine | Admitting: Emergency Medicine

## 2022-06-17 ENCOUNTER — Other Ambulatory Visit: Payer: Self-pay

## 2022-06-17 ENCOUNTER — Encounter (HOSPITAL_BASED_OUTPATIENT_CLINIC_OR_DEPARTMENT_OTHER): Payer: Self-pay | Admitting: Emergency Medicine

## 2022-06-17 ENCOUNTER — Emergency Department (HOSPITAL_BASED_OUTPATIENT_CLINIC_OR_DEPARTMENT_OTHER): Payer: Managed Care, Other (non HMO)

## 2022-06-17 DIAGNOSIS — R197 Diarrhea, unspecified: Secondary | ICD-10-CM | POA: Diagnosis not present

## 2022-06-17 DIAGNOSIS — R110 Nausea: Secondary | ICD-10-CM | POA: Diagnosis not present

## 2022-06-17 DIAGNOSIS — R1013 Epigastric pain: Secondary | ICD-10-CM | POA: Diagnosis present

## 2022-06-17 LAB — CBC WITH DIFFERENTIAL/PLATELET
Abs Immature Granulocytes: 0.12 10*3/uL — ABNORMAL HIGH (ref 0.00–0.07)
Basophils Absolute: 0.1 10*3/uL (ref 0.0–0.1)
Basophils Relative: 1 %
Eosinophils Absolute: 0.5 10*3/uL (ref 0.0–0.5)
Eosinophils Relative: 4 %
HCT: 36.7 % (ref 36.0–46.0)
Hemoglobin: 12.3 g/dL (ref 12.0–15.0)
Immature Granulocytes: 1 %
Lymphocytes Relative: 33 %
Lymphs Abs: 4 10*3/uL (ref 0.7–4.0)
MCH: 30.5 pg (ref 26.0–34.0)
MCHC: 33.5 g/dL (ref 30.0–36.0)
MCV: 91.1 fL (ref 80.0–100.0)
Monocytes Absolute: 0.9 10*3/uL (ref 0.1–1.0)
Monocytes Relative: 8 %
Neutro Abs: 6.5 10*3/uL (ref 1.7–7.7)
Neutrophils Relative %: 53 %
Platelets: 382 10*3/uL (ref 150–400)
RBC: 4.03 MIL/uL (ref 3.87–5.11)
RDW: 13.7 % (ref 11.5–15.5)
WBC: 12.1 10*3/uL — ABNORMAL HIGH (ref 4.0–10.5)
nRBC: 0 % (ref 0.0–0.2)

## 2022-06-17 LAB — COMPREHENSIVE METABOLIC PANEL
ALT: 13 U/L (ref 0–44)
AST: 15 U/L (ref 15–41)
Albumin: 3.6 g/dL (ref 3.5–5.0)
Alkaline Phosphatase: 75 U/L (ref 38–126)
Anion gap: 6 (ref 5–15)
BUN: 11 mg/dL (ref 6–20)
CO2: 26 mmol/L (ref 22–32)
Calcium: 8.7 mg/dL — ABNORMAL LOW (ref 8.9–10.3)
Chloride: 105 mmol/L (ref 98–111)
Creatinine, Ser: 0.82 mg/dL (ref 0.44–1.00)
GFR, Estimated: >60 mL/min (ref >60)
Glucose, Bld: 130 mg/dL — ABNORMAL HIGH (ref 70–99)
Potassium: 3.8 mmol/L (ref 3.5–5.1)
Sodium: 137 mmol/L (ref 135–145)
Total Bilirubin: 0.4 mg/dL (ref 0.3–1.2)
Total Protein: 7.4 g/dL (ref 6.5–8.1)

## 2022-06-17 LAB — URINALYSIS, ROUTINE W REFLEX MICROSCOPIC
Bilirubin Urine: NEGATIVE
Glucose, UA: NEGATIVE mg/dL
Hgb urine dipstick: NEGATIVE
Ketones, ur: NEGATIVE mg/dL
Leukocytes,Ua: NEGATIVE
Nitrite: NEGATIVE
Protein, ur: NEGATIVE mg/dL
Specific Gravity, Urine: 1.01 (ref 1.005–1.030)
pH: 6 (ref 5.0–8.0)

## 2022-06-17 LAB — HCG, SERUM, QUALITATIVE: Preg, Serum: NEGATIVE

## 2022-06-17 LAB — LIPASE, BLOOD: Lipase: 31 U/L (ref 11–51)

## 2022-06-17 MED ORDER — SUCRALFATE 1 GM/10ML PO SUSP
1.0000 g | Freq: Once | ORAL | Status: AC
  Administered 2022-06-17: 1 g via ORAL
  Filled 2022-06-17: qty 10

## 2022-06-17 MED ORDER — ONDANSETRON HCL 4 MG/2ML IJ SOLN
4.0000 mg | Freq: Once | INTRAMUSCULAR | Status: AC
  Administered 2022-06-17: 4 mg via INTRAVENOUS
  Filled 2022-06-17: qty 2

## 2022-06-17 MED ORDER — FAMOTIDINE IN NACL 20-0.9 MG/50ML-% IV SOLN
20.0000 mg | Freq: Once | INTRAVENOUS | Status: AC
  Administered 2022-06-17: 20 mg via INTRAVENOUS
  Filled 2022-06-17: qty 50

## 2022-06-17 MED ORDER — FENTANYL CITRATE PF 50 MCG/ML IJ SOSY
100.0000 ug | PREFILLED_SYRINGE | Freq: Once | INTRAMUSCULAR | Status: AC
  Administered 2022-06-17: 100 ug via INTRAVENOUS
  Filled 2022-06-17: qty 2

## 2022-06-17 MED ORDER — IOHEXOL 300 MG/ML  SOLN
125.0000 mL | Freq: Once | INTRAMUSCULAR | Status: AC | PRN
  Administered 2022-06-17: 125 mL via INTRAVENOUS

## 2022-06-17 MED ORDER — SUCRALFATE 1 G PO TABS: ORAL_TABLET | ORAL | 1 refills | Status: DC

## 2022-06-17 MED ORDER — FAMOTIDINE 40 MG PO TABS: 40.0000 mg | ORAL_TABLET | Freq: Two times a day (BID) | ORAL | 0 refills | Status: DC

## 2022-06-17 MED ORDER — IOHEXOL 9 MG/ML PO SOLN
500.0000 mL | ORAL | Status: AC
  Administered 2022-06-17 (×2): 500 mL via ORAL

## 2022-06-17 MED ORDER — PANTOPRAZOLE SODIUM 40 MG IV SOLR
40.0000 mg | Freq: Once | INTRAVENOUS | Status: AC
  Administered 2022-06-17: 40 mg via INTRAVENOUS
  Filled 2022-06-17: qty 10

## 2022-06-17 MED ORDER — SODIUM CHLORIDE 0.9 % IV BOLUS
1000.0000 mL | Freq: Once | INTRAVENOUS | Status: AC
  Administered 2022-06-17: 1000 mL via INTRAVENOUS

## 2022-06-17 NOTE — ED Triage Notes (Signed)
Pt states she is having extreme pain in her upper abdomen  Pt states she has been on medication regularly  Pt states today around 2pm the pain started  Pt states she took OTC medication without relief   Pt is c/o nausea without vomiting

## 2022-06-17 NOTE — ED Provider Notes (Addendum)
[facility] Provider Note: Lowella Dell, MD, FACEP  CSN: [id number] MRN: [id number] ARRIVAL: 06/17/22 at 0047 ROOM: [facility]10/[facility]10   CHIEF COMPLAINT  Abdominal Pain   HISTORY OF PRESENT ILLNESS  06/17/22 3:16 AM Carrie Sparks is a 41 y.o. female with epigastric pain that began yesterday afternoon about 2 PM and is gradually worsened.  It is now severe.  She describes it as feeling like a severe hunger pain.  It is worse with palpation or movement.  She has had nausea and diarrhea but no vomiting.  She takes Protonix 40 mg daily.   Past Medical History:  Diagnosis Date   Allergy    seasonal allergies   Anxiety    Irritable bowel     Past Surgical History:  Procedure Laterality Date   CESAREAN SECTION     LASIK     MOUTH SURGERY     Partial detatched retina     WISDOM TOOTH EXTRACTION      Family History  Problem Relation Age of Onset   Ulcers Mother    CAD Maternal Grandfather     Social History   Tobacco Use   Smoking status: Never   Smokeless tobacco: Never  Vaping Use   Vaping Use: Never used  Substance Use Topics   Alcohol use: Yes    Comment: occasionally   Drug use: No    Prior to Admission medications   Medication Sig Start Date End Date Taking? Authorizing Provider  famotidine (PEPCID) 40 MG tablet Take 1 tablet (40 mg total) by mouth 2 (two) times daily. 06/17/22  Yes Rital Cavey, MD  sucralfate (CARAFATE) 1 g tablet Take 1 tablet 3 times a day with meals and at bedtime.  Take your pantoprazole (Protonix) at least 30 minutes before the first morning dose of sucralfate. 06/17/22  Yes Rily Nickey, MD  Norgestrel-Ethinyl Estradiol (CRYSELLE-28 PO) Take by mouth.      [provider]  pantoprazole (PROTONIX) 40 MG tablet Take 1 tablet (40 mg total) by mouth daily. 09/30/21 10/30/21  Jeannie Fend, PA-C  topiramate (TOPAMAX) 25 MG capsule Take 25 mg by mouth 2 (two) times daily.      [provider]  zolpidem (AMBIEN  CR) 6.25 MG CR tablet Take 6.25 mg by mouth at bedtime as needed.      [provider]    Allergies Hydrocodone   REVIEW OF SYSTEMS  Negative except as noted here or in the History of Present Illness.   PHYSICAL EXAMINATION  Initial Vital Signs Blood pressure (!) 147/91, pulse 82, temperature 98.1 F (36.7 C), temperature source Oral, resp. rate 16, height 5\' 4"  (1.626 m), weight 108 kg, SpO2 97 %.  Examination General: Well-developed, well-nourished female in no acute distress; appearance consistent with age of record HENT: normocephalic; atraumatic Eyes: Normal appearance Neck: supple Heart: regular rate and rhythm Lungs: clear to auscultation bilaterally Abdomen: soft; nondistended; epigastric tenderness; bowel sounds present Extremities: No deformity; full range of motion; pulses normal Neurologic: Awake, alert and oriented; motor function intact in all extremities and symmetric; no facial droop Skin: Warm and dry Psychiatric: Grimacing   RESULTS  Summary of this visit's results, reviewed and interpreted by myself:   EKG Interpretation  Date/Time:    Ventricular Rate:    PR Interval:    QRS Duration:   QT Interval:    QTC Calculation:   R Axis:     Text Interpretation:  Laboratory Studies: Results for orders placed or performed during the hospital encounter of 06/17/22 (from the past 24 hour(s))  Comprehensive metabolic panel     Status: Abnormal   Collection Time: 06/17/22  3:36 AM  Result Value Ref Range   Sodium 137 135 - 145 mmol/L   Potassium 3.8 3.5 - 5.1 mmol/L   Chloride 105 98 - 111 mmol/L   CO2 26 22 - 32 mmol/L   Glucose, Bld 130 (H) 70 - 99 mg/dL   BUN 11 6 - 20 mg/dL   Creatinine, Ser 3.08 0.44 - 1.00 mg/dL   Calcium 8.7 (L) 8.9 - 10.3 mg/dL   Total Protein 7.4 6.5 - 8.1 g/dL   Albumin 3.6 3.5 - 5.0 g/dL   AST 15 15 - 41 U/L   ALT 13 0 - 44 U/L   Alkaline Phosphatase 75 38 - 126 U/L   Total Bilirubin 0.4 0.3 - 1.2 mg/dL    GFR, Estimated >65 >78 mL/min   Anion gap 6 5 - 15  Lipase, blood     Status: None   Collection Time: 06/17/22  3:36 AM  Result Value Ref Range   Lipase 31 11 - 51 U/L  CBC with Differential     Status: Abnormal   Collection Time: 06/17/22  3:36 AM  Result Value Ref Range   WBC 12.1 (H) 4.0 - 10.5 K/uL   RBC 4.03 3.87 - 5.11 MIL/uL   Hemoglobin 12.3 12.0 - 15.0 g/dL   HCT 46.9 62.9 - 52.8 %   MCV 91.1 80.0 - 100.0 fL   MCH 30.5 26.0 - 34.0 pg   MCHC 33.5 30.0 - 36.0 g/dL   RDW 41.3 24.4 - 01.0 %   Platelets 382 150 - 400 K/uL   nRBC 0.0 0.0 - 0.2 %   Neutrophils Relative % 53 %   Neutro Abs 6.5 1.7 - 7.7 K/uL   Lymphocytes Relative 33 %   Lymphs Abs 4.0 0.7 - 4.0 K/uL   Monocytes Relative 8 %   Monocytes Absolute 0.9 0.1 - 1.0 K/uL   Eosinophils Relative 4 %   Eosinophils Absolute 0.5 0.0 - 0.5 K/uL   Basophils Relative 1 %   Basophils Absolute 0.1 0.0 - 0.1 K/uL   Immature Granulocytes 1 %   Abs Immature Granulocytes 0.12 (H) 0.00 - 0.07 K/uL  hCG, serum, qualitative     Status: None   Collection Time: 06/17/22  3:36 AM  Result Value Ref Range   Preg, Serum NEGATIVE NEGATIVE  Urinalysis, Routine w reflex microscopic Urine, Clean Catch     Status: Abnormal   Collection Time: 06/17/22  6:11 AM  Result Value Ref Range   Color, Urine YELLOW YELLOW   APPearance CLOUDY (A) CLEAR   Specific Gravity, Urine 1.010 1.005 - 1.030   pH 6.0 5.0 - 8.0   Glucose, UA NEGATIVE NEGATIVE mg/dL   Hgb urine dipstick NEGATIVE NEGATIVE   Bilirubin Urine NEGATIVE NEGATIVE   Ketones, ur NEGATIVE NEGATIVE mg/dL   Protein, ur NEGATIVE NEGATIVE mg/dL   Nitrite NEGATIVE NEGATIVE   Leukocytes,Ua NEGATIVE NEGATIVE   Imaging Studies: CT ABDOMEN PELVIS W CONTRAST  Result Date: 06/17/2022 CLINICAL DATA:  Epigastric pain. EXAM: CT ABDOMEN AND PELVIS WITH CONTRAST TECHNIQUE: Multidetector CT imaging of the abdomen and pelvis was performed using the standard protocol following bolus administration  of intravenous contrast. RADIATION DOSE REDUCTION: This exam was performed according to the departmental dose-optimization program which includes automated exposure control, adjustment of  the mA and/or kV according to patient size and/or use of iterative reconstruction technique. CONTRAST:  OMNIPAQUE IOHEXOL 300 MG/ML  SOLN COMPARISON:  None Available. FINDINGS: Lower chest: No acute abnormality. Hepatobiliary: No focal liver abnormality is seen. No gallstones, gallbladder wall thickening, or biliary dilatation. Pancreas: Unremarkable. No pancreatic ductal dilatation or surrounding inflammatory changes. Spleen: Normal in size without focal abnormality. Adrenals/Urinary Tract: 1.2 cm left adrenal nodule measures 72 Hounsfield units, image 23/2. Normal right adrenal gland. No nephrolithiasis, hydronephrosis or mass. Urinary bladder appears unremarkable. Stomach/Bowel: Stomach appears normal. The appendix is visualized and appears normal. There is no bowel wall thickening, inflammation, or distension. Vascular/Lymphatic: Normal appearance of the abdominal aorta. Upper abdominal vascularity appears patent. No abdominopelvic adenopathy. Reproductive: Uterus and bilateral adnexa are unremarkable. Other: No free fluid or fluid collection Musculoskeletal: No acute or significant osseous findings. IMPRESSION: 1. No acute findings within the abdomen or pelvis. 2. 1.2 cm left adrenal mass, probable benign adenoma. Recommend 1 year follow up adrenal washout CT. If stable for > 1 year, no further f/u imaging. JACR 2017 Aug; 14(8):1038-44, JCAT 2016 Mar-Apr; 40(2):194-200, Urol J 2006 Spring; 3(2):71-4. Electronically Signed   By: Signa Kell M.D.   On: 06/17/2022 05:50    ED COURSE and MDM  Nursing notes, initial and subsequent vitals signs, including pulse oximetry, reviewed and interpreted by myself.  Vitals:   06/17/22 0104 06/17/22 0106 06/17/22 0609  BP:  (!) 147/91 (!) 107/42  Pulse:  82 77  Resp:  16 18   Temp:  98.1 F (36.7 C)   TempSrc:  Oral   SpO2:  97% 95%  Weight: 108 kg    Height: 5\' 4"  (1.626 m)     Medications  famotidine (PEPCID) IVPB 20 mg premix (20 mg Intravenous New Bag/Given 06/17/22 0607)  sodium chloride 0.9 % bolus 1,000 mL (0 mLs Intravenous Stopped 06/17/22 0444)  ondansetron (ZOFRAN) injection 4 mg (4 mg Intravenous Given 06/17/22 0335)  pantoprazole (PROTONIX) injection 40 mg (40 mg Intravenous Given 06/17/22 0343)  fentaNYL (SUBLIMAZE) injection 100 mcg (100 mcg Intravenous Given 06/17/22 0337)  iohexol (OMNIPAQUE) 9 MG/ML oral solution 500 mL (500 mLs Oral Contrast Given 06/17/22 0501)  iohexol (OMNIPAQUE) 300 MG/ML solution 125 mL (125 mLs Intravenous Contrast Given 06/17/22 0505)  sucralfate (CARAFATE) 1 GM/10ML suspension 1 g (1 g Oral Given 06/17/22 0605)   5:58 AM Patient feeling significantly better.  She was advised of reassuring CT findings as well as the incidental mass on the adrenal gland suspicious for benign adenoma.  She was advised she should have this followed up in a year with her primary care physician.  I suspect her symptoms are being caused by gastritis or peptic ulcer disease.  She is already on a PPI.  We will add an H2 blocker and Carafate and refer to gastroenterology.  She was advised she could have H. pylori that we are not able to diagnose readily in the ED.   PROCEDURES  Procedures   ED DIAGNOSES     ICD-10-CM   1. Epigastric pain  R10.13          Spruha Weight, 08/18/22, MD 06/17/22 0602    08/18/22, MD 06/17/22 [phone]

## 2022-09-11 ENCOUNTER — Ambulatory Visit
Admission: RE | Admit: 2022-09-11 | Discharge: 2022-09-11 | Disposition: A | Discharge status: Home / Self Care | Payer: Managed Care, Other (non HMO) | Source: Ambulatory Visit | Attending: Family Medicine | Admitting: Family Medicine

## 2022-09-11 ENCOUNTER — Other Ambulatory Visit: Payer: Self-pay | Admitting: Family Medicine

## 2022-09-11 DIAGNOSIS — Z1231 Encounter for screening mammogram for malignant neoplasm of breast: Secondary | ICD-10-CM

## 2022-11-20 IMAGING — DX DG CHEST 2V
2 series · 2 of 2 positions shown · non-contrast
Comparison: 11/24/2016

CLINICAL DATA: Chest pain

EXAM:
CHEST - 2 VIEW

[chest pa]
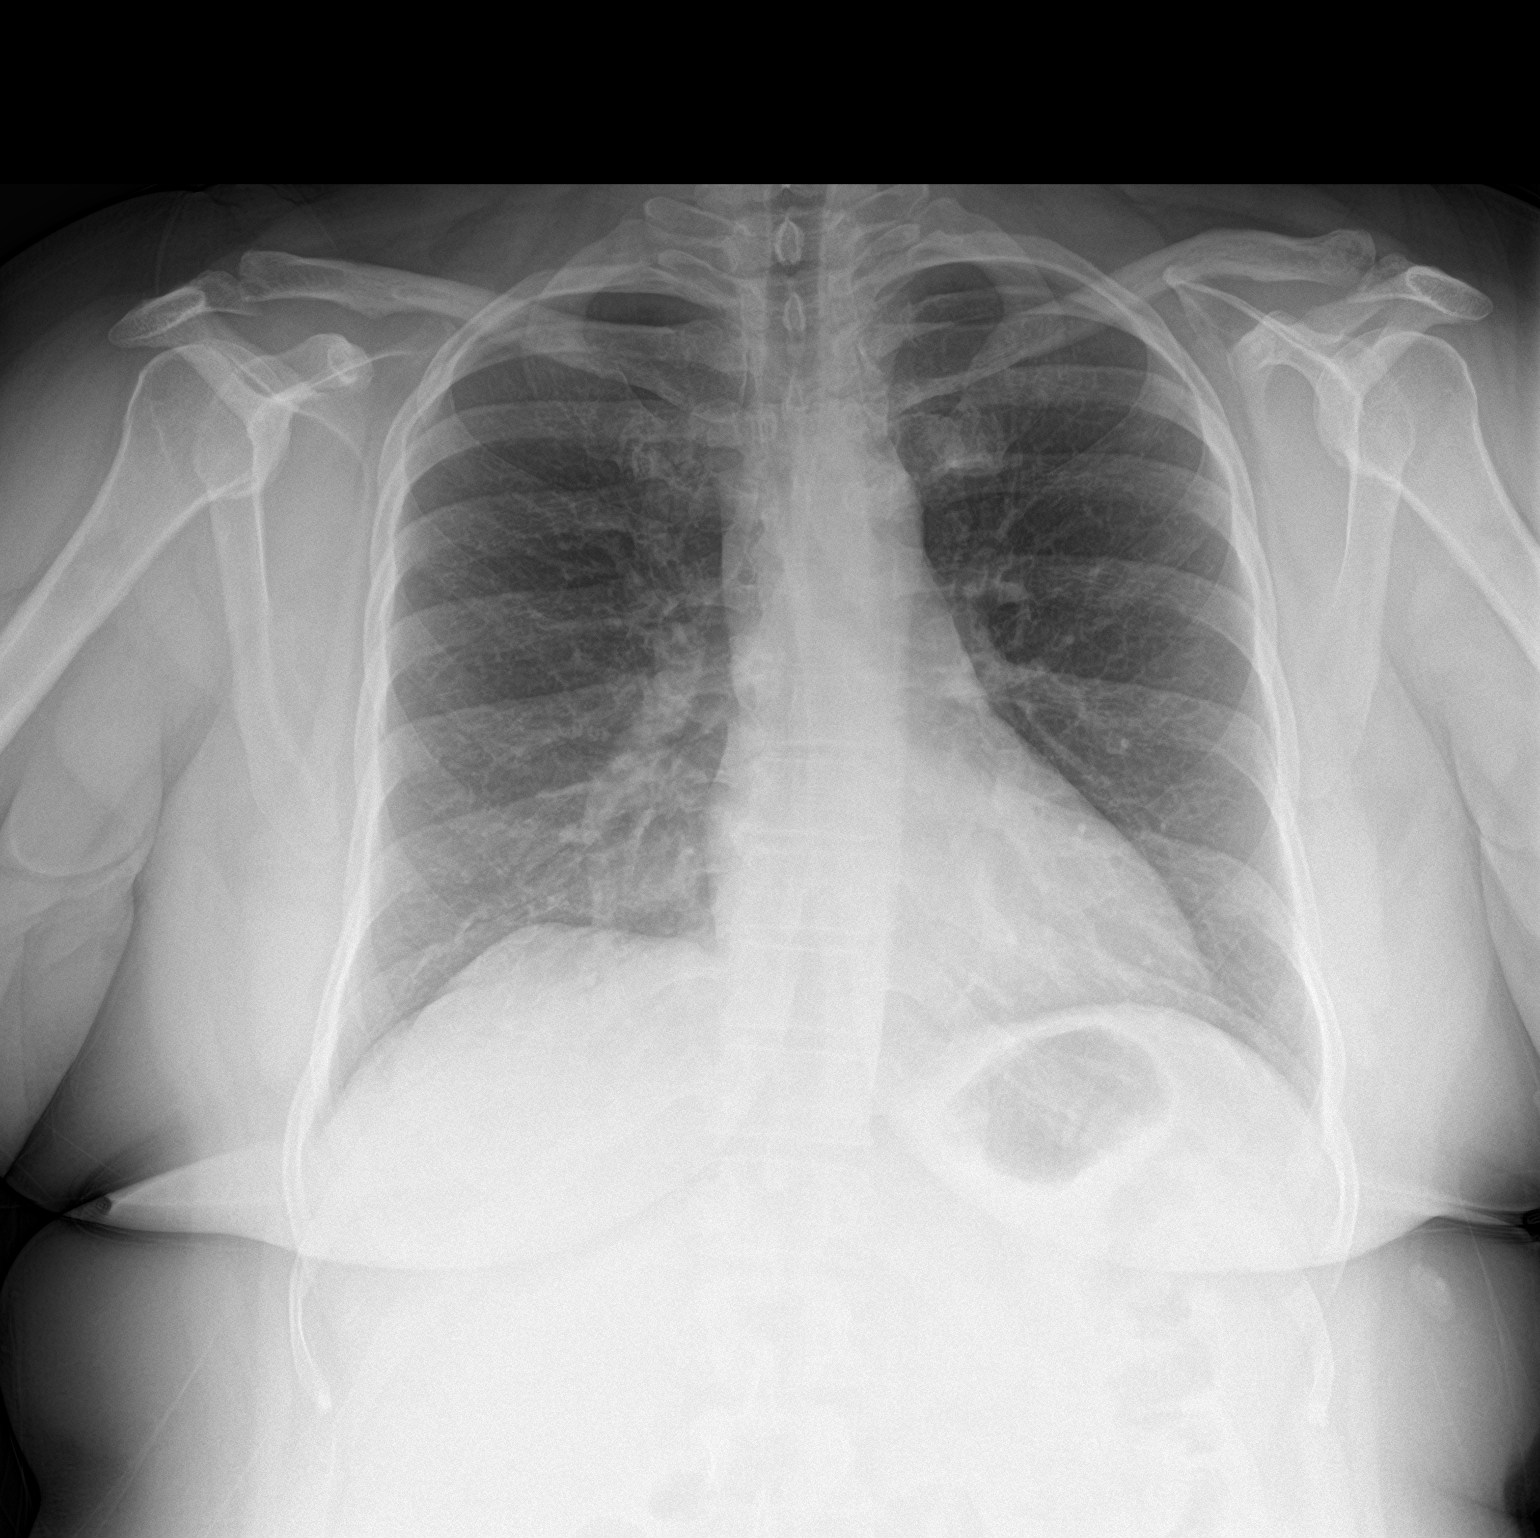

[chest lat]
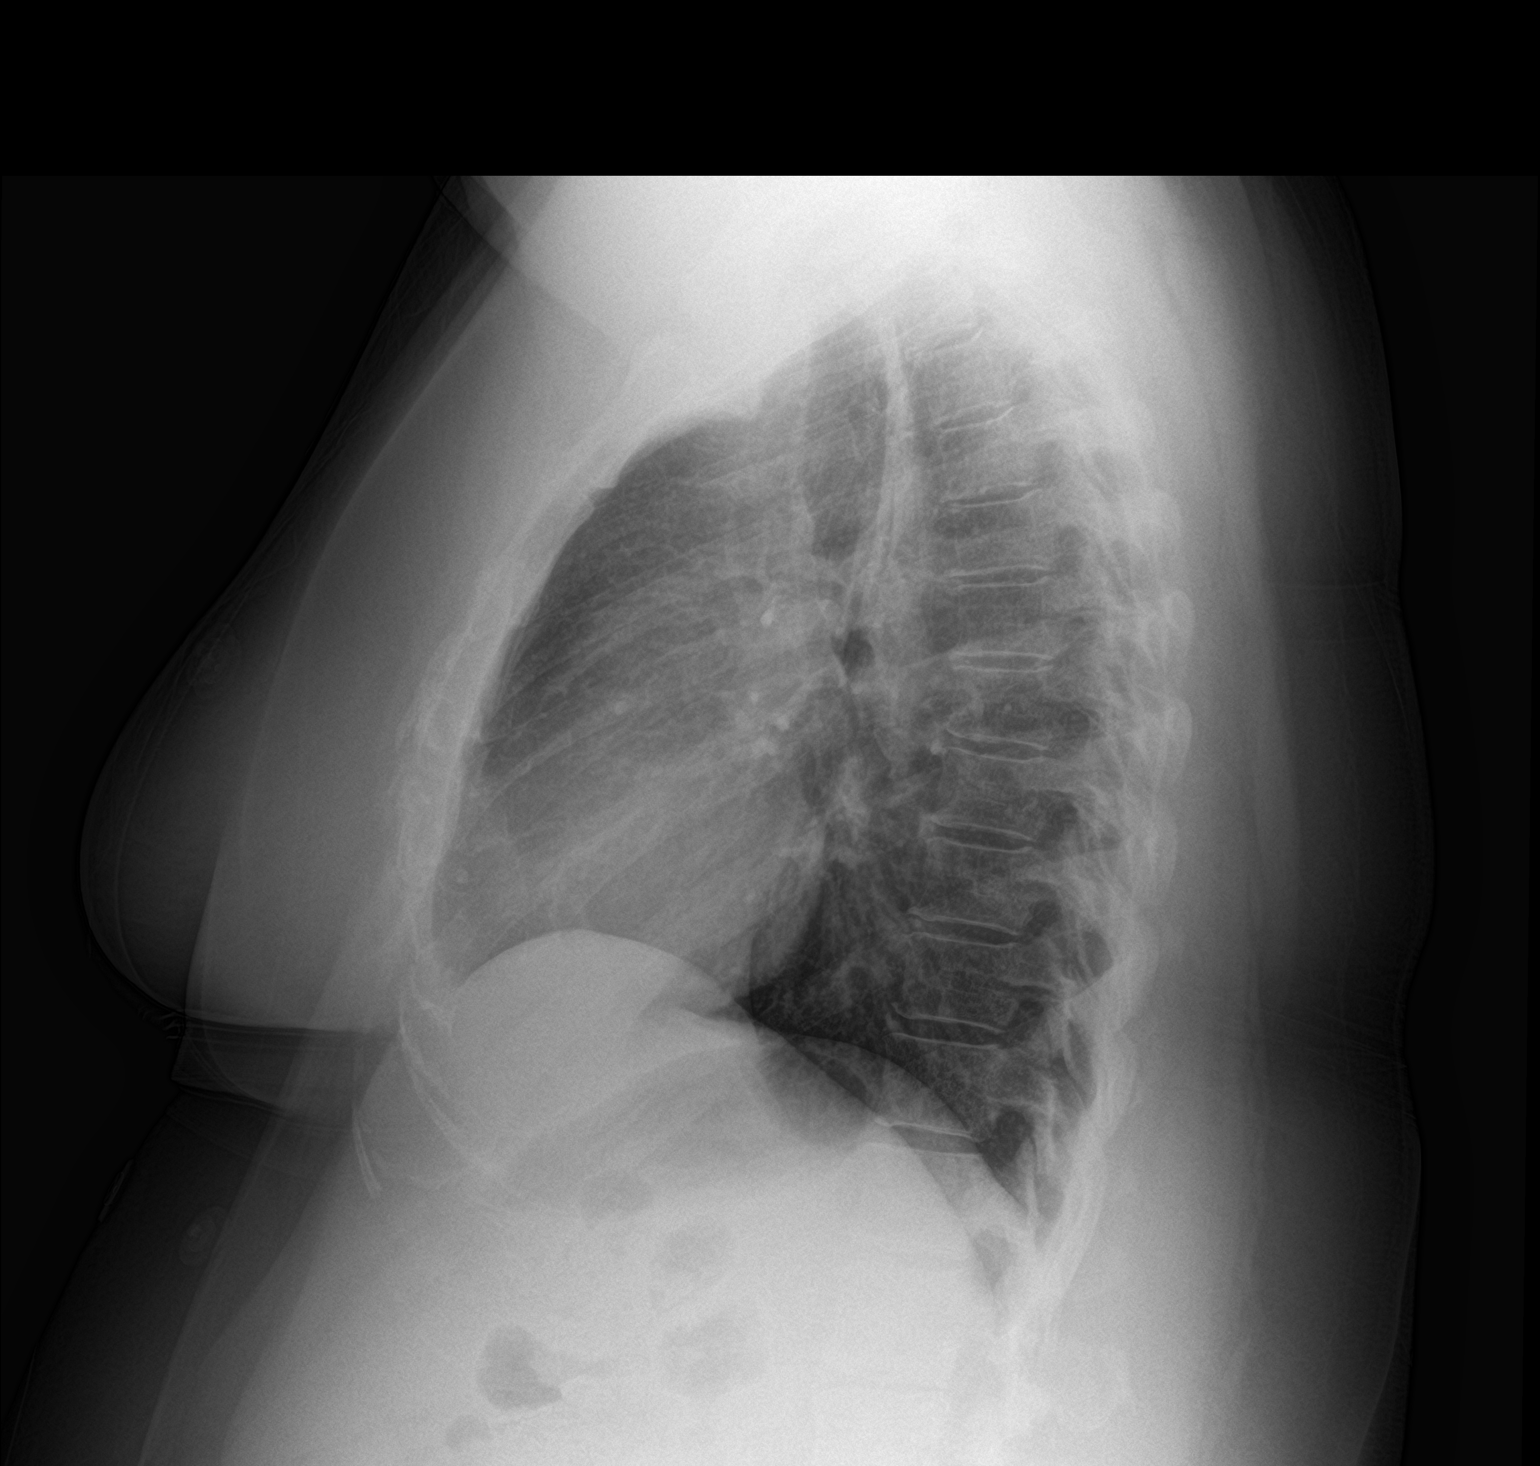

[2 of 2 positions shown; findings below may reference images not displayed]

FINDINGS: Heart and mediastinal contours are within normal limits. No focal
opacities or effusions. No acute bony abnormality.
IMPRESSION: No active cardiopulmonary disease.

## 2022-11-20 IMAGING — US US ABDOMEN LIMITED
1 series · 14 of 25 positions shown · non-contrast
Comparison: CT 02/29/2012

CLINICAL DATA: Right upper quadrant pain

EXAM:
ULTRASOUND ABDOMEN LIMITED RIGHT UPPER QUADRANT

[Series 1: us abdomen limited · 14 of 52 slices shown]
[im 1/52]
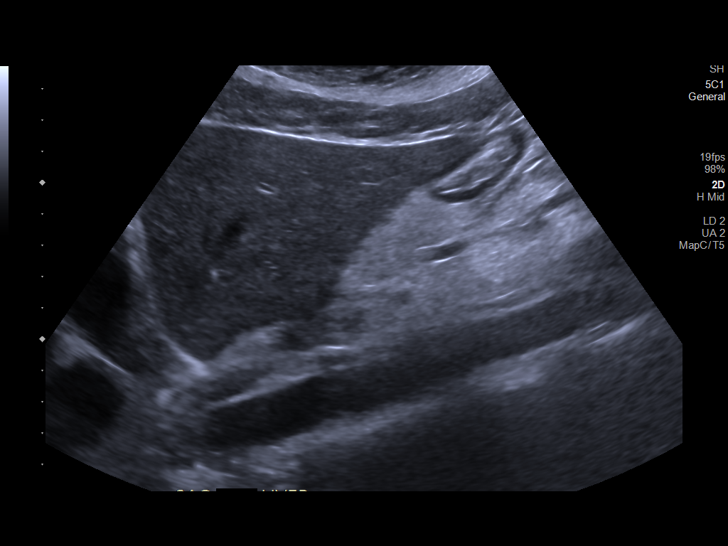
[im 5/52]
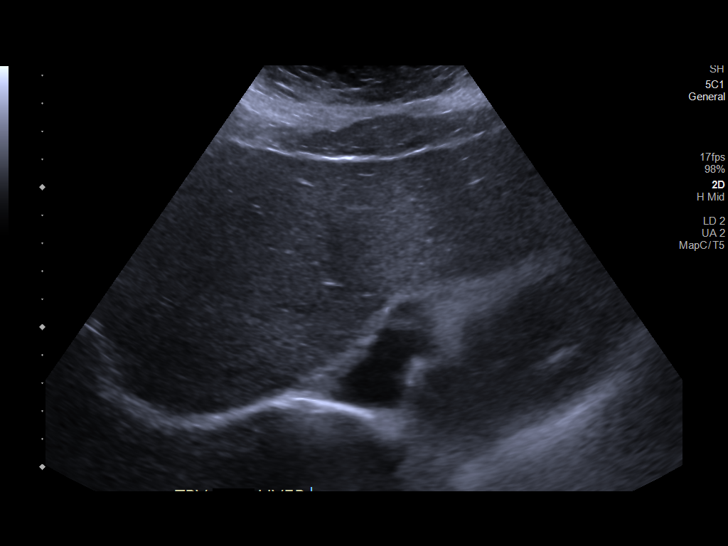
[im 9/52]
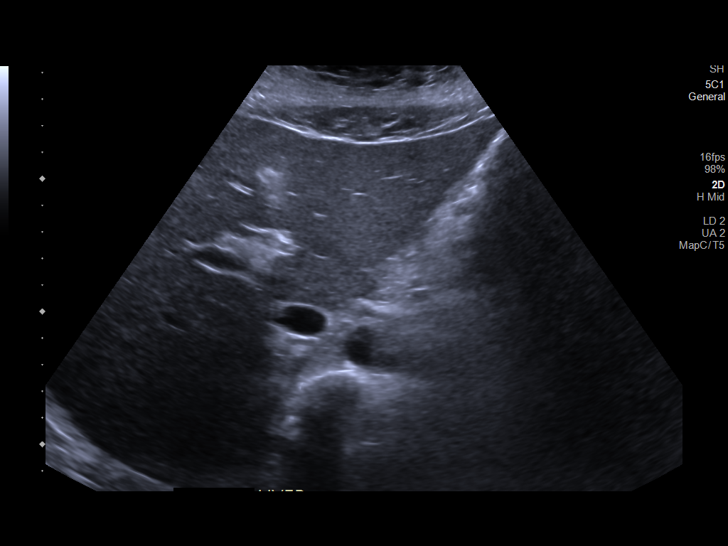
[im 13/52]
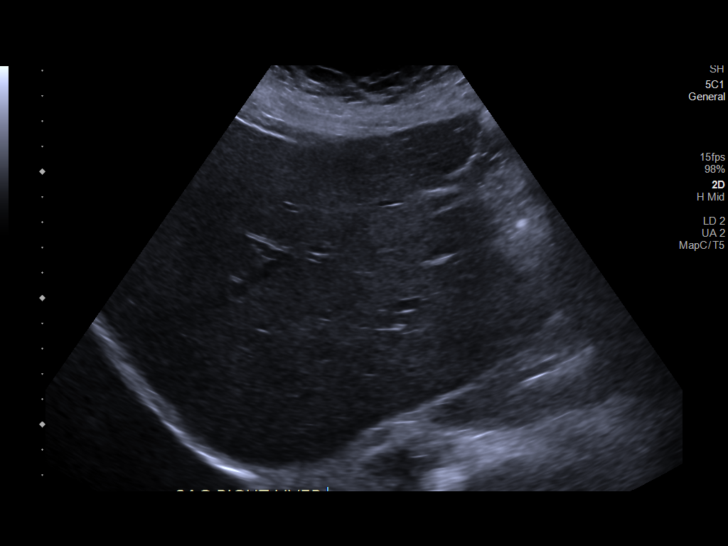
[im 18/52]
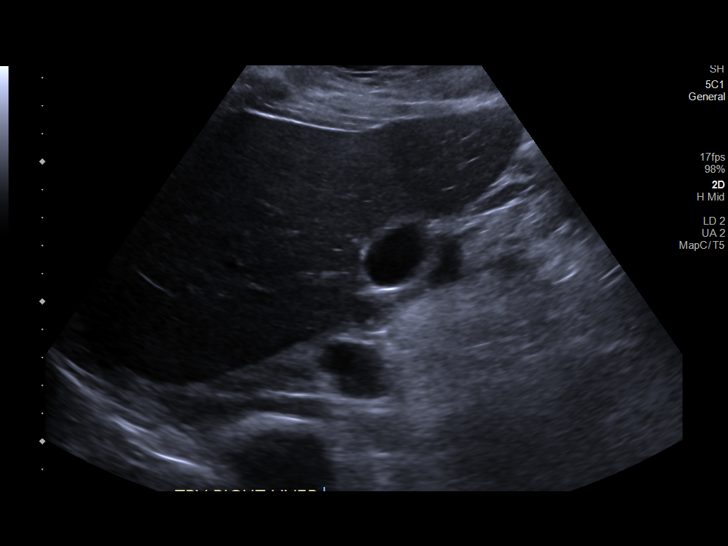
[im 20/52]
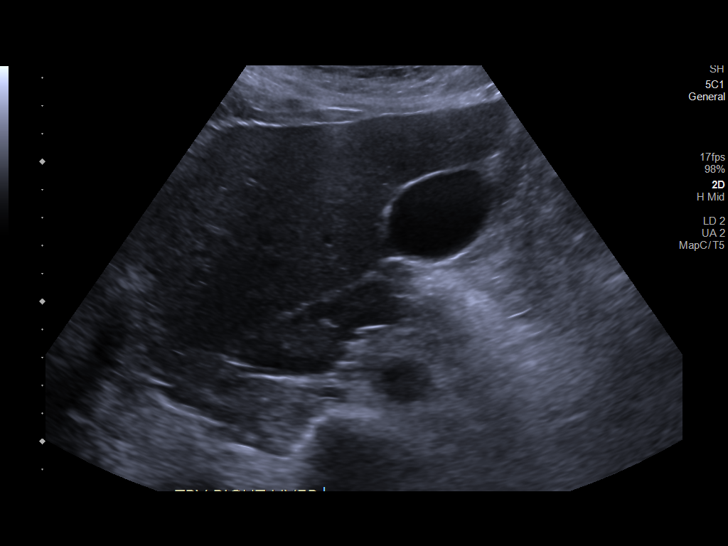
[im 24/52]
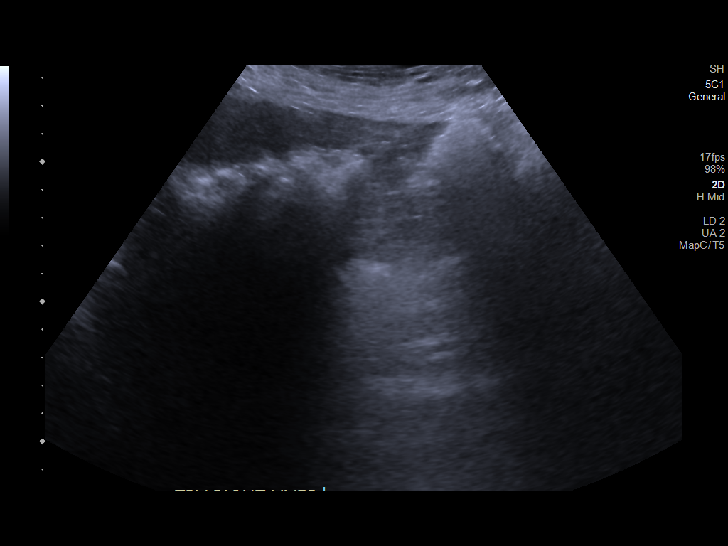
[im 28/52]
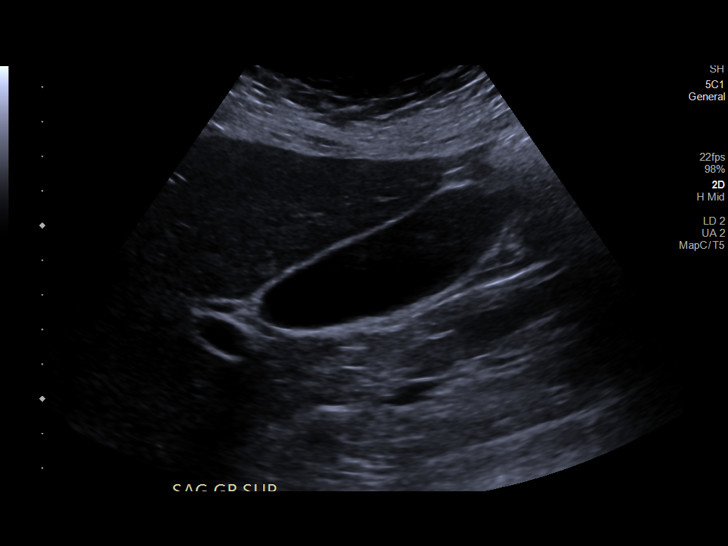
[im 32/52]
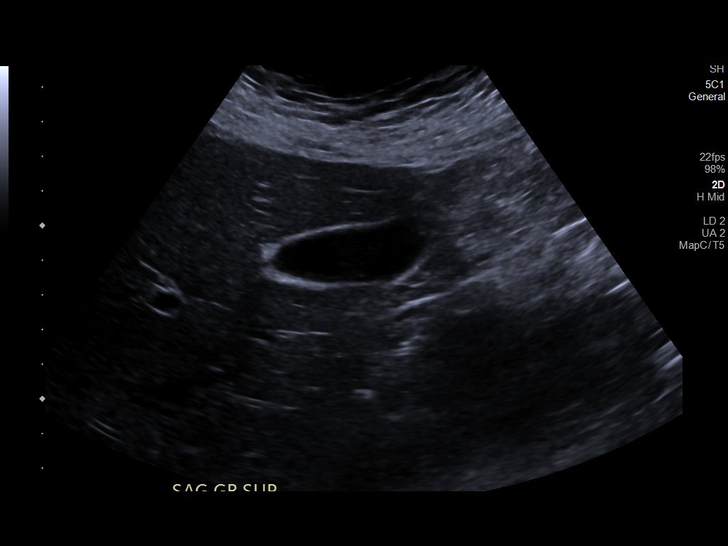
[im 35/52]
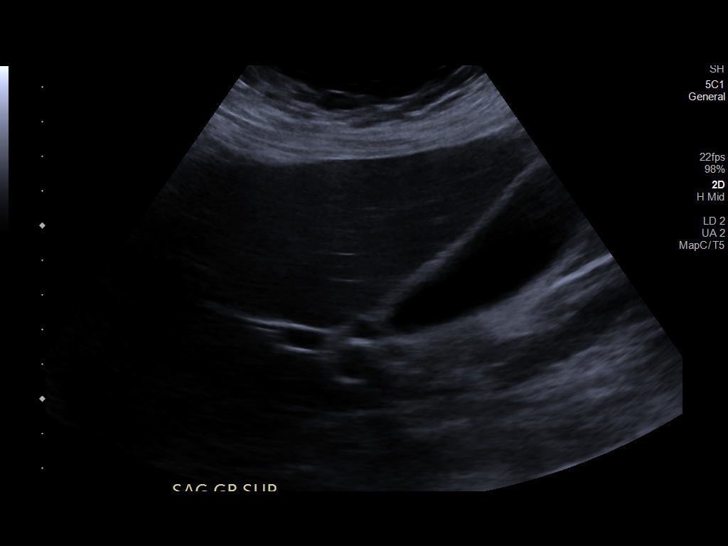
[im 39/52]
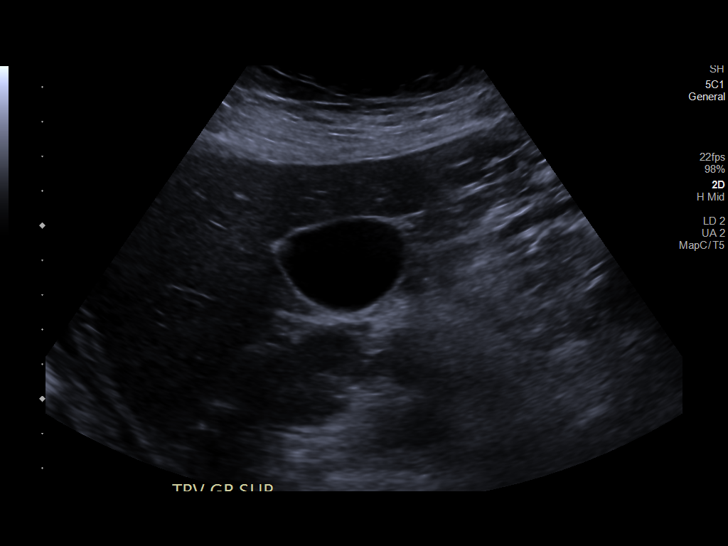
[im 43/52]
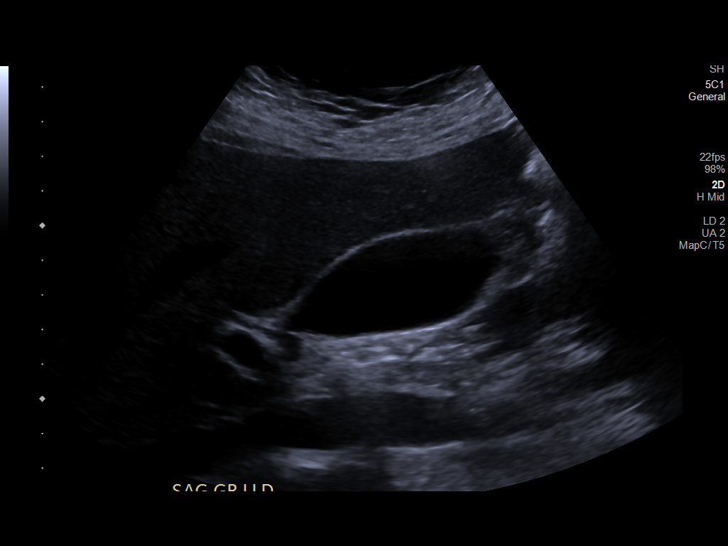
[im 47/52]
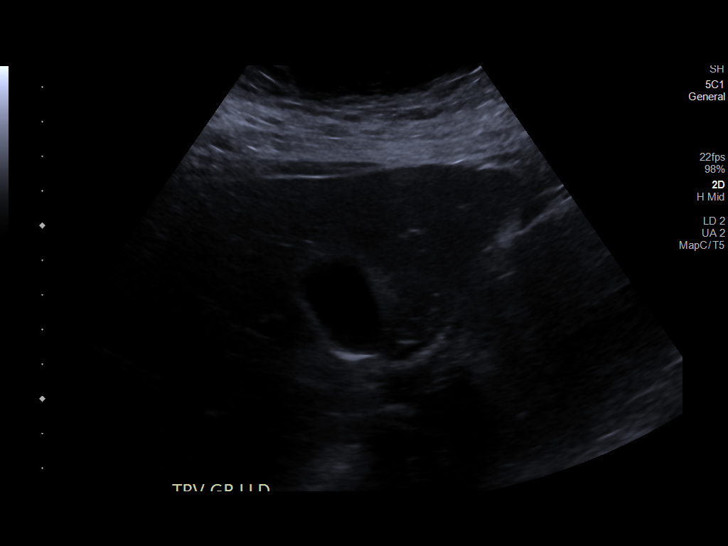
[im 52/52]
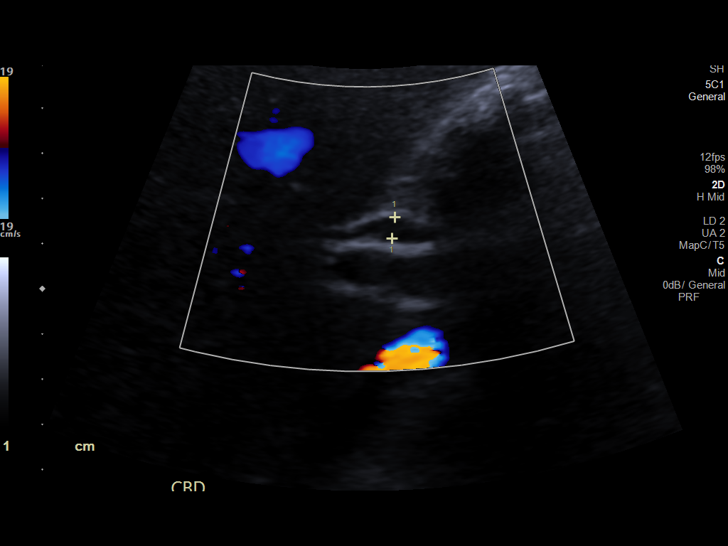

[14 of 25 positions shown; findings below may reference images not displayed]

FINDINGS: Gallbladder:

No gallstones or wall thickening visualized. No sonographic Murphy
sign noted by sonographer.

Common bile duct:

Diameter: 4.8 mm

Liver:

No focal lesion identified. Within normal limits in parenchymal
echogenicity. Portal vein is patent on color Doppler imaging with
normal direction of blood flow towards the liver.

Other: None.
IMPRESSION: Negative examination

## 2023-03-10 DIAGNOSIS — R7309 Other abnormal glucose: Secondary | ICD-10-CM | POA: Insufficient documentation

## 2023-09-03 ENCOUNTER — Other Ambulatory Visit: Payer: Self-pay | Admitting: Family Medicine

## 2023-09-03 DIAGNOSIS — Z1231 Encounter for screening mammogram for malignant neoplasm of breast: Secondary | ICD-10-CM

## 2023-10-02 ENCOUNTER — Ambulatory Visit: Payer: Managed Care, Other (non HMO)

## 2023-10-23 ENCOUNTER — Ambulatory Visit
Admission: RE | Admit: 2023-10-23 | Discharge: 2023-10-23 | Disposition: A | Discharge status: Home / Self Care | Payer: Managed Care, Other (non HMO) | Source: Ambulatory Visit | Attending: Family Medicine | Admitting: Family Medicine

## 2023-10-23 DIAGNOSIS — Z1231 Encounter for screening mammogram for malignant neoplasm of breast: Secondary | ICD-10-CM

## 2023-12-22 ENCOUNTER — Ambulatory Visit: Payer: Managed Care, Other (non HMO) | Admitting: Family Medicine

## 2023-12-25 ENCOUNTER — Encounter: Payer: Self-pay | Admitting: Family Medicine

## 2023-12-25 ENCOUNTER — Ambulatory Visit: Payer: Managed Care, Other (non HMO) | Admitting: Family Medicine

## 2023-12-25 VITALS — BP 118/80 | HR 81 | Temp 99.0°F | Ht 64.0 in | Wt 212.0 lb

## 2023-12-25 DIAGNOSIS — Z6836 Body mass index (BMI) 36.0-36.9, adult: Secondary | ICD-10-CM | POA: Diagnosis not present

## 2023-12-25 DIAGNOSIS — G2581 Restless legs syndrome: Secondary | ICD-10-CM | POA: Diagnosis not present

## 2023-12-25 DIAGNOSIS — E039 Hypothyroidism, unspecified: Secondary | ICD-10-CM | POA: Insufficient documentation

## 2023-12-25 DIAGNOSIS — F339 Major depressive disorder, recurrent, unspecified: Secondary | ICD-10-CM | POA: Insufficient documentation

## 2023-12-25 DIAGNOSIS — Z6833 Body mass index (BMI) 33.0-33.9, adult: Secondary | ICD-10-CM | POA: Insufficient documentation

## 2023-12-25 MED ORDER — GABAPENTIN 100 MG PO CAPS: 100.0000 mg | ORAL_CAPSULE | Freq: Four times a day (QID) | ORAL | 1 refills | Status: DC

## 2023-12-25 NOTE — Progress Notes (Addendum)
 New Patient Office Visit  Subjective    Patient ID: Carrie Sparks, female    DOB: Oct 11, 1981  Age: 43 y.o. MRN: [id number]  CC:  Chief Complaint  Patient presents with   Establish Care    HPI Carrie Sparks presents to establish care 1- Follow up Hypothyroidism-patient has been taking levothyroxine  100 mcg once a day and takes an extra half tablet once per week.  Patient needs to have the TSH rechecked.  Patient is not sure if the dosage is right or not. We made an adjustment a few months ago.  Patient states energy level is improved overall.  Patient has lost weight but has been trying to do so.   2-RLS-Pt taking Gabapentin  100 mg up to 3 per day -one in the AM and 2 in the PM but thinks she needs to inc to 3 at night time.  Patient thinks the gabapentin  helps a lot with the restless leg syndrome.  Patient would like to continue medication.   3-Elevated BMI-pt doing generic Ozempic . Pt lost 30 pounds the past year. Walking on treadmill few times per week. Going to [place] in May and wants to be able to walk on her trip.  She is trying to get 10,000 steps per day. Pt rather get the Wegovy  from me if her insurance covers it.  Patient not having nausea or vomiting with the generic Ozempic .  Patient is having some constipation.  Patient does have a previous history of prediabetes.  Last A1c 6.1. 4- Depression/Anxiety-. She reports that she started seeing a psychiatrist in [place] in person and via zoom.  Now taking Trintellix 20mg  once per day & Buspar 7.5 mg twice a day. Pt states she does see Psychiatrist & Counselor online. Pt states that Buspar has been helping with Anxiety.  Patient has also been placed on Rexulti the past month and that has made a difference.  Patient thinks that the combination of Trintellix, BuSpar, trazodone and Rexulti have made her feel like a normal person.  Seeing Psychiatrist every 3 months.  HM- has UTD mammogram, UTD flu shot.  Pt had colonoscopy  previously 12 years ago. Pt will need next year.  The 10-year ASCVD risk score (Arnett DK, et al., 2019) is: 0.9%   Values used to calculate the score:     Age: 82 years     Sex: Female     Is Non-Hispanic African American: No     Diabetic: No     Tobacco smoker: No     Systolic Blood Pressure: 118 mmHg     Is BP treated: No     HDL Cholesterol: 50 MG/DL     Total Cholesterol: 215 MG/DL The 89-bzjm ASCVD risk score (Arnett DK, et al., 2019) is: 1.3%   Values used to calculate the score:     Age: 89 years     Sex: Female     Is Non-Hispanic African American: No     Diabetic: No     Tobacco smoker: No     Systolic Blood Pressure: 118 mmHg     Is BP treated: No     HDL Cholesterol: 44 mg/dL     Total Cholesterol: 242 mg/dL   Outpatient Encounter Medications as of 12/25/2023  Medication Sig   brexpiprazole (REXULTI) 0.25 MG TABS tablet Take 0.25 mg by mouth daily.   busPIRone (BUSPAR) 7.5 MG tablet Take 7.5 mg by mouth 2 (two) times daily.   levothyroxine  (SYNTHROID ) 100 MCG  tablet Take 100 mcg by mouth daily before breakfast. Takes 150mcg once a week   pantoprazole  (PROTONIX ) 40 MG tablet Take 40 mg by mouth daily.   spironolactone (ALDACTONE) 50 MG tablet Take 50 mg by mouth in the morning, at noon, and at bedtime.   traZODone (DESYREL) 50 MG tablet Take 50 mg by mouth at bedtime.   vortioxetine HBr (TRINTELLIX) 20 MG TABS tablet Take 20 mg by mouth daily.   [DISCONTINUED] gabapentin  (NEURONTIN ) 100 MG capsule Take 100 mg by mouth 3 (three) times daily. 1 in the morning and 2 at night.   gabapentin  (NEURONTIN ) 100 MG capsule Take 1 capsule (100 mg total) by mouth 4 (four) times daily. 1 in the morning and 3 at night (restless syndrome)   pantoprazole  (PROTONIX ) 40 MG tablet Take 1 tablet (40 mg total) by mouth daily.   [DISCONTINUED] famotidine  (PEPCID ) 40 MG tablet Take 1 tablet (40 mg total) by mouth 2 (two) times daily.   [DISCONTINUED] Norgestrel-Ethinyl Estradiol (CRYSELLE-28  PO) Take by mouth.     [DISCONTINUED] sucralfate  (CARAFATE ) 1 g tablet Take 1 tablet 3 times a day with meals and at bedtime.  Take your pantoprazole  (Protonix ) at least 30 minutes before the first morning dose of sucralfate .   [DISCONTINUED] topiramate (TOPAMAX) 25 MG capsule Take 25 mg by mouth 2 (two) times daily.     [DISCONTINUED] zolpidem (AMBIEN CR) 6.25 MG CR tablet Take 6.25 mg by mouth at bedtime as needed.     No facility-administered encounter medications on file as of 12/25/2023.    Past Medical History:  Diagnosis Date   Allergy    seasonal allergies   Anxiety    GERD (gastroesophageal reflux disease)    Irritable bowel    Thyroid disease     Past Surgical History:  Procedure Laterality Date   ABLATION     uterine ablation- 2018   CESAREAN SECTION     2014 and 2017   LASIK     MOUTH SURGERY     Partial detatched retina     WISDOM TOOTH EXTRACTION      Family History  Problem Relation Age of Onset   Ulcers Mother    Osteoporosis Mother    Neurologic Disorder Mother    Osteoporosis Father    Heart Problems Father    CAD Maternal Grandfather     Social History   Socioeconomic History   Marital status: Married    Spouse name: Not on file   Number of children: Not on file   Years of education: Not on file   Highest education level: Not on file  Occupational History   Not on file  Tobacco Use   Smoking status: Never   Smokeless tobacco: Never  Vaping Use   Vaping status: Never Used  Substance and Sexual Activity   Alcohol use: Yes    Comment: occasionally   Drug use: No   Sexual activity: Yes  Other Topics Concern   Not on file  Social History Narrative   Not on file   Social Drivers of Health   Financial Resource Strain: Not on file  Food Insecurity: Not on file  Transportation Needs: Not on file  Physical Activity: Not on file  Stress: Not on file  Social Connections: Unknown (04/13/2022)   Received from [facility], [facility]    Social Network    Social Network: Not on file  Intimate Partner Violence: Unknown (03/21/2022)   Received from [facility], [facility]  HITS    Physically Hurt: Not on file    Insult or Talk Down To: Not on file    Threaten Physical Harm: Not on file    Scream or Curse: Not on file    ROS      Objective    BP 118/80 (BP Location: Left Arm)   Pulse 81   Temp 99 F (37.2 C)   Ht 5' 4 (1.626 m)   Wt 212 lb (96.2 kg)   SpO2 98%   BMI 36.39 kg/m   Physical Exam Vitals and nursing note reviewed.  Constitutional:      Appearance: Normal appearance.  HENT:     Head: Normocephalic.     Right Ear: Tympanic membrane, ear canal and external ear normal.     Left Ear: Tympanic membrane, ear canal and external ear normal.  Eyes:     Extraocular Movements: Extraocular movements intact.     Conjunctiva/sclera: Conjunctivae normal.     Pupils: Pupils are equal, round, and reactive to light.  Cardiovascular:     Rate and Rhythm: Normal rate and regular rhythm.     Heart sounds: Normal heart sounds.  Pulmonary:     Effort: Pulmonary effort is normal.     Breath sounds: Normal breath sounds.  Musculoskeletal:     Right lower leg: No edema.     Left lower leg: No edema.  Neurological:     General: No focal deficit present.     Mental Status: She is alert and oriented to person, place, and time. Mental status is at baseline.  Psychiatric:        Mood and Affect: Mood normal.        Behavior: Behavior normal.        Thought Content: Thought content normal.        Judgment: Judgment normal.         Assessment & Plan:   Problem List Items Addressed This Visit       Endocrine   Hypothyroidism   Relevant Medications   levothyroxine  (SYNTHROID ) 100 MCG tablet   Other Relevant Orders   TSH     Other   BMI 36.0-36.9,adult   Relevant Orders   CBC with Differential/Platelet   Comprehensive metabolic panel   Lipid panel   Hemoglobin A1c   Depression, recurrent  (HCC)   Relevant Medications   traZODone (DESYREL) 50 MG tablet   brexpiprazole (REXULTI) 0.25 MG TABS tablet   vortioxetine HBr (TRINTELLIX) 20 MG TABS tablet   busPIRone (BUSPAR) 7.5 MG tablet   Restless leg syndrome - Primary   Relevant Medications   gabapentin  (NEURONTIN ) 100 MG capsule   RESOLVED: Obesity, morbid (HCC)    No follow-ups on file.  Obesity- Patient will find out which weight loss medication is covered i.e. Wegovy  versus a bone.  Patient thinks Wegovy  is covered and she would rather get it prescribed by me as opposed to going through specialty pharmacy.  Recommend healthy diet and consistent exercise and gradual weight loss.  Congratulated her on her ongoing weight loss thus far.  Return in 3 months or sooner if necessary. For the restless leg we will increase the gabapentin  to 3 at night and continue 1 during the day. Follow-up on lab work and notify patient and adjust thyroid medication if necessary.  Connie Emperor, MD

## 2023-12-26 LAB — LIPID PANEL
Cholesterol: 242 mg/dL — ABNORMAL HIGH (ref <200)
HDL: 44 mg/dL — ABNORMAL LOW (ref >=50)
LDL Cholesterol (Calc): 152 mg/dL — ABNORMAL HIGH
Non-HDL Cholesterol (Calc): 198 mg/dL — ABNORMAL HIGH (ref <130)
Total CHOL/HDL Ratio: 5.5 (calc) — ABNORMAL HIGH (ref <5.0)
Triglycerides: 281 mg/dL — ABNORMAL HIGH (ref <150)

## 2023-12-26 LAB — CBC WITH DIFFERENTIAL/PLATELET
Absolute Lymphocytes: 2876 {cells}/uL (ref 850–3900)
Absolute Monocytes: 765 {cells}/uL (ref 200–950)
Basophils Absolute: 102 {cells}/uL (ref 0–200)
Basophils Relative: 1 %
Eosinophils Absolute: 224 {cells}/uL (ref 15–500)
Eosinophils Relative: 2.2 %
HCT: 41.6 % (ref 35.0–45.0)
Hemoglobin: 13.9 g/dL (ref 11.7–15.5)
MCH: 31.2 pg (ref 27.0–33.0)
MCHC: 33.4 g/dL (ref 32.0–36.0)
MCV: 93.5 fL (ref 80.0–100.0)
MPV: 9.5 fL (ref 7.5–12.5)
Monocytes Relative: 7.5 %
Neutro Abs: 6232 {cells}/uL (ref 1500–7800)
Neutrophils Relative %: 61.1 %
Platelets: 428 10*3/uL — ABNORMAL HIGH (ref 140–400)
RBC: 4.45 10*6/uL (ref 3.80–5.10)
RDW: 12.7 % (ref 11.0–15.0)
Total Lymphocyte: 28.2 %
WBC: 10.2 10*3/uL (ref 3.8–10.8)

## 2023-12-26 LAB — HEMOGLOBIN A1C
Hgb A1c MFr Bld: 5.9 %{Hb} — ABNORMAL HIGH (ref <5.7)
Mean Plasma Glucose: 123 mg/dL
eAG (mmol/L): 6.8 mmol/L

## 2023-12-26 LAB — COMPREHENSIVE METABOLIC PANEL
AG Ratio: 1.7 (calc) (ref 1.0–2.5)
ALT: 14 U/L (ref 6–29)
AST: 12 U/L (ref 10–30)
Albumin: 4.7 g/dL (ref 3.6–5.1)
Alkaline phosphatase (APISO): 70 U/L (ref 31–125)
BUN/Creatinine Ratio: 16 (calc) (ref 6–22)
BUN: 16 mg/dL (ref 7–25)
CO2: 27 mmol/L (ref 20–32)
Calcium: 10.3 mg/dL — ABNORMAL HIGH (ref 8.6–10.2)
Chloride: 101 mmol/L (ref 98–110)
Creat: 1.02 mg/dL — ABNORMAL HIGH (ref 0.50–0.99)
Globulin: 2.7 g/dL (ref 1.9–3.7)
Glucose, Bld: 90 mg/dL (ref 65–99)
Potassium: 4.5 mmol/L (ref 3.5–5.3)
Sodium: 138 mmol/L (ref 135–146)
Total Bilirubin: 0.4 mg/dL (ref 0.2–1.2)
Total Protein: 7.4 g/dL (ref 6.1–8.1)

## 2023-12-26 LAB — TSH: TSH: 5.33 m[IU]/L — ABNORMAL HIGH

## 2023-12-28 ENCOUNTER — Encounter: Payer: Self-pay | Admitting: Family Medicine

## 2023-12-29 ENCOUNTER — Other Ambulatory Visit: Payer: Self-pay

## 2023-12-29 ENCOUNTER — Telehealth: Payer: Self-pay | Admitting: Family Medicine

## 2023-12-29 DIAGNOSIS — E039 Hypothyroidism, unspecified: Secondary | ICD-10-CM

## 2023-12-29 MED ORDER — ZEPBOUND 2.5 MG/0.5ML ~~LOC~~ SOAJ
2.5000 mg | SUBCUTANEOUS | 1 refills | Status: DC
Start: 2023-12-29 — End: 2024-03-29

## 2023-12-29 MED ORDER — PANTOPRAZOLE SODIUM 40 MG PO TBEC: 40.0000 mg | DELAYED_RELEASE_TABLET | Freq: Every day | ORAL | 1 refills | Status: DC

## 2023-12-29 MED ORDER — LEVOTHYROXINE SODIUM 100 MCG PO TABS: 100.0000 ug | ORAL_TABLET | Freq: Every day | ORAL | 1 refills | Status: DC

## 2023-12-29 NOTE — Addendum Note (Signed)
 Addended by: Angelica Chessman on: 12/29/2023 08:42 AM   Modules accepted: Orders

## 2023-12-30 ENCOUNTER — Other Ambulatory Visit: Payer: Self-pay

## 2023-12-30 DIAGNOSIS — E039 Hypothyroidism, unspecified: Secondary | ICD-10-CM

## 2023-12-30 MED ORDER — LEVOTHYROXINE SODIUM 112 MCG PO TABS: 112.0000 ug | ORAL_TABLET | Freq: Every day | ORAL | 3 refills | Status: DC

## 2024-01-05 ENCOUNTER — Other Ambulatory Visit: Payer: Self-pay

## 2024-01-05 DIAGNOSIS — R7309 Other abnormal glucose: Secondary | ICD-10-CM

## 2024-01-05 MED ORDER — SEMAGLUTIDE(0.25 OR 0.5MG/DOS) 2 MG/3ML ~~LOC~~ SOPN: 0.2500 mg | PEN_INJECTOR | SUBCUTANEOUS | 1 refills | Status: DC

## 2024-02-09 ENCOUNTER — Encounter: Payer: Self-pay | Admitting: Family Medicine

## 2024-02-11 ENCOUNTER — Other Ambulatory Visit: Payer: Self-pay | Admitting: Family Medicine

## 2024-02-11 MED ORDER — OSELTAMIVIR PHOSPHATE 75 MG PO CAPS: 75.0000 mg | ORAL_CAPSULE | Freq: Two times a day (BID) | ORAL | 0 refills | Status: DC

## 2024-02-11 NOTE — Progress Notes (Signed)
 Tamiflu prophylaxis due to household exposure. Child diagnosed yesterday.

## 2024-03-14 ENCOUNTER — Other Ambulatory Visit: Payer: Self-pay | Admitting: Family Medicine

## 2024-03-14 DIAGNOSIS — Z1231 Encounter for screening mammogram for malignant neoplasm of breast: Secondary | ICD-10-CM

## 2024-03-25 ENCOUNTER — Ambulatory Visit: Payer: Managed Care, Other (non HMO) | Admitting: Family Medicine

## 2024-03-29 ENCOUNTER — Ambulatory Visit: Admitting: Family Medicine

## 2024-03-29 ENCOUNTER — Encounter: Payer: Self-pay | Admitting: Family Medicine

## 2024-03-29 VITALS — BP 124/80 | HR 87 | Temp 98.7°F | Ht 64.0 in | Wt 193.5 lb

## 2024-03-29 DIAGNOSIS — G4709 Other insomnia: Secondary | ICD-10-CM | POA: Diagnosis not present

## 2024-03-29 DIAGNOSIS — E039 Hypothyroidism, unspecified: Secondary | ICD-10-CM

## 2024-03-29 DIAGNOSIS — R7309 Other abnormal glucose: Secondary | ICD-10-CM

## 2024-03-29 DIAGNOSIS — G2581 Restless legs syndrome: Secondary | ICD-10-CM | POA: Diagnosis not present

## 2024-03-29 DIAGNOSIS — E7800 Pure hypercholesterolemia, unspecified: Secondary | ICD-10-CM

## 2024-03-29 DIAGNOSIS — Z6833 Body mass index (BMI) 33.0-33.9, adult: Secondary | ICD-10-CM

## 2024-03-29 NOTE — Progress Notes (Signed)
 Patient Office Visit  Assessment & Plan:  Elevated glucose -     Hemoglobin A1c  Hypothyroidism, unspecified type -     TSH  Restless leg syndrome  Other insomnia  Elevated cholesterol -     COMPLETE METABOLIC PANEL WITHOUT GFR -     Lipid panel  BMI 33.0-33.9,adult   Follow-up on lab work and notify patient.  Patient believes her new insurance will cover Zepbound for weight loss.  Continue healthy diet and consistent exercise.  Return in the next 3 to 4 months or sooner if necessary. Recommend healthy diet i.e mediterranean/DASH diet, consistent exercise - 30 minutes 5 day per week, and gradual weight loss.  Return in about 4 months (around 07/29/2024), or if symptoms worsen or fail to improve, for physical.   Subjective:    Patient ID: Carrie Sparks, female    DOB: [birth year]  Age: 43 y.o. MRN: [id number]  Chief Complaint  Patient presents with   Medical Management of Chronic Issues    HPI Got a new job at [employer] and is enjoying it. Pt has been doing it for few weeks.  RLS- patient gabapentin 1 in AM and 2 PM. Pt tried increasing the gabapentin to 3 at nighttime but felt weird the next day so she went back to 2 at nighttime.  Patient is doing well with the current regimen. Prediabetes-  Denies diarrhea, peripheral swelling, hypoglycemia, excessive thirst, excessive urination, visual fluctuation, and fatigue.  Making an effort on diet control and exercise.  Patient does eat healthy for the most part and stays active.  Patient is still using the compounded Ozempic.  Patient has some constipation with that but has been able to increase fiber and do okay with that.  Pt has new insurance and thinks it will cover Zepbound.  Depression/Anxiety-is seeing psychiatry regarding this.  Pt doing much better. Pt continues taking Trintellix, Buspar, Trazodone and Rexulti which are all working well for her. Pt thinks this tweaking of her meds made a huge improvement.   Hypothyroidism-patient had abnormal TSH last time.  Patient did change medication dosage and is here for recheck.  Patient currently taking levothyroxine 112 mcg/day. Abnormal kidney function-patient does not take NSAIDs.  Patient is trying to drink enough water. The 10-year ASCVD risk score (Arnett DK, et al., 2019) is: 1.4% The 10-year ASCVD risk score (Arnett DK, et al., 2019) is: 1%   Values used to calculate the score:     Age: 12 years     Sex: Female     Is Non-Hispanic African American: No     Diabetic: No     Tobacco smoker: No     Systolic Blood Pressure: 124 mmHg     Is BP treated: No     HDL Cholesterol: 47 mg/dL     Total Cholesterol: 208 mg/dL  Past Medical History:  Diagnosis Date   Allergy    seasonal allergies   Anxiety    GERD (gastroesophageal reflux disease)    Irritable bowel    Thyroid disease    Past Surgical History:  Procedure Laterality Date   ABLATION     uterine ablation- 2018   CESAREAN SECTION     2014 and 2017   LASIK     MOUTH SURGERY     Partial detatched retina     WISDOM TOOTH EXTRACTION     Social History   Tobacco Use   Smoking status: Never   Smokeless tobacco: Never  Vaping Use   Vaping status: Never Used  Substance Use Topics   Alcohol use: Yes    Comment: occasionally   Drug use: No   Family History  Problem Relation Age of Onset   Ulcers Mother    Osteoporosis Mother    Neurologic Disorder Mother    Osteoporosis Father    Heart Problems Father    CAD Maternal Grandfather    Allergies  Allergen Reactions   Codeine Shortness Of Breath, Itching, Nausea And Vomiting and Nausea Only   Hydrocodone Shortness Of Breath and Nausea And Vomiting   Lactose Diarrhea    ROS    Objective:    BP 124/80   Pulse 87   Temp 98.7 F (37.1 C)   Ht 5\' 4"  (1.626 m)   Wt 193 lb 8 oz (87.8 kg)   SpO2 98%   BMI 33.21 kg/m  BP Readings from Last 3 Encounters:  03/29/24 124/80  12/25/23 118/80  06/17/22 (!) 107/42   Wt  Readings from Last 3 Encounters:  03/29/24 193 lb 8 oz (87.8 kg)  12/25/23 212 lb (96.2 kg)  06/17/22 238 lb (108 kg)    Physical Exam Vitals and nursing note reviewed.  Constitutional:      Appearance: Normal appearance.  HENT:     Head: Normocephalic.     Right Ear: Tympanic membrane and ear canal normal.     Left Ear: Tympanic membrane and ear canal normal.  Eyes:     Extraocular Movements: Extraocular movements intact.     Pupils: Pupils are equal, round, and reactive to light.  Cardiovascular:     Rate and Rhythm: Normal rate and regular rhythm.     Heart sounds: Normal heart sounds.  Pulmonary:     Effort: Pulmonary effort is normal.     Breath sounds: Normal breath sounds.  Neurological:     General: No focal deficit present.     Mental Status: She is alert and oriented to person, place, and time.  Psychiatric:        Mood and Affect: Mood normal.        Behavior: Behavior normal.      Results for orders placed or performed in visit on 03/29/24  HM PAP SMEAR  Result Value Ref Range   HM Pap smear normal

## 2024-03-30 ENCOUNTER — Encounter: Payer: Self-pay | Admitting: Family Medicine

## 2024-03-30 ENCOUNTER — Other Ambulatory Visit: Payer: Self-pay

## 2024-03-30 LAB — COMPLETE METABOLIC PANEL WITHOUT GFR
AG Ratio: 1.8 (calc) (ref 1.0–2.5)
ALT: 11 U/L (ref 6–29)
AST: 11 U/L (ref 10–30)
Albumin: 4.6 g/dL (ref 3.6–5.1)
Alkaline phosphatase (APISO): 63 U/L (ref 31–125)
BUN/Creatinine Ratio: 16 (calc) (ref 6–22)
BUN: 18 mg/dL (ref 7–25)
CO2: 27 mmol/L (ref 20–32)
Calcium: 9.9 mg/dL (ref 8.6–10.2)
Chloride: 101 mmol/L (ref 98–110)
Creat: 1.12 mg/dL — ABNORMAL HIGH (ref 0.50–0.99)
Globulin: 2.6 g/dL (ref 1.9–3.7)
Glucose, Bld: 93 mg/dL (ref 65–99)
Potassium: 4.9 mmol/L (ref 3.5–5.3)
Sodium: 137 mmol/L (ref 135–146)
Total Bilirubin: 0.3 mg/dL (ref 0.2–1.2)
Total Protein: 7.2 g/dL (ref 6.1–8.1)

## 2024-03-30 LAB — LIPID PANEL
Cholesterol: 208 mg/dL — ABNORMAL HIGH (ref <200)
HDL: 47 mg/dL — ABNORMAL LOW (ref >=50)
LDL Cholesterol (Calc): 125 mg/dL — ABNORMAL HIGH
Non-HDL Cholesterol (Calc): 161 mg/dL — ABNORMAL HIGH (ref <130)
Total CHOL/HDL Ratio: 4.4 (calc) (ref <5.0)
Triglycerides: 225 mg/dL — ABNORMAL HIGH (ref <150)

## 2024-03-30 LAB — HEMOGLOBIN A1C
Hgb A1c MFr Bld: 5.8 % — ABNORMAL HIGH (ref <5.7)
Mean Plasma Glucose: 120 mg/dL
eAG (mmol/L): 6.6 mmol/L

## 2024-03-30 LAB — TSH: TSH: 6.64 m[IU]/L — ABNORMAL HIGH

## 2024-03-30 MED ORDER — LEVOTHYROXINE SODIUM 125 MCG PO TABS: 125.0000 ug | ORAL_TABLET | Freq: Every day | ORAL | 3 refills | Status: DC

## 2024-04-08 ENCOUNTER — Other Ambulatory Visit: Payer: Self-pay

## 2024-04-08 MED ORDER — ZEPBOUND 2.5 MG/0.5ML ~~LOC~~ SOAJ: 2.5000 mg | SUBCUTANEOUS | 0 refills | Status: DC

## 2024-04-19 ENCOUNTER — Other Ambulatory Visit: Payer: Self-pay

## 2024-04-19 MED ORDER — ROPINIROLE HCL 0.25 MG PO TABS: 0.2500 mg | ORAL_TABLET | Freq: Every day | ORAL | 1 refills | Status: DC

## 2024-05-10 ENCOUNTER — Other Ambulatory Visit: Payer: Self-pay

## 2024-05-10 DIAGNOSIS — R944 Abnormal results of kidney function studies: Secondary | ICD-10-CM

## 2024-05-10 NOTE — Progress Notes (Signed)
 Erroneous error

## 2024-06-18 ENCOUNTER — Other Ambulatory Visit: Payer: Self-pay | Admitting: Family Medicine

## 2024-06-29 ENCOUNTER — Encounter: Payer: Self-pay | Admitting: Family Medicine

## 2024-06-29 ENCOUNTER — Other Ambulatory Visit: Payer: Self-pay | Admitting: Family Medicine

## 2024-06-29 ENCOUNTER — Ambulatory Visit: Admitting: Family Medicine

## 2024-06-29 VITALS — BP 120/64 | HR 84 | Temp 98.3°F | Ht 64.0 in | Wt 197.0 lb

## 2024-06-29 DIAGNOSIS — R944 Abnormal results of kidney function studies: Secondary | ICD-10-CM

## 2024-06-29 DIAGNOSIS — E039 Hypothyroidism, unspecified: Secondary | ICD-10-CM

## 2024-06-29 DIAGNOSIS — R7309 Other abnormal glucose: Secondary | ICD-10-CM | POA: Diagnosis not present

## 2024-06-29 DIAGNOSIS — Z6833 Body mass index (BMI) 33.0-33.9, adult: Secondary | ICD-10-CM

## 2024-06-29 MED ORDER — ZEPBOUND 5 MG/0.5ML ~~LOC~~ SOAJ: 5.0000 mg | SUBCUTANEOUS | 2 refills | Status: DC

## 2024-06-29 NOTE — Progress Notes (Signed)
 Patient Office Visit  Assessment & Plan:  Elevated glucose -     Hemoglobin A1c -     COMPLETE METABOLIC PANEL WITHOUT GFR  Hypothyroidism, unspecified type -     TSH -     COMPLETE METABOLIC PANEL WITHOUT GFR  Abnormal kidney function study -     COMPLETE METABOLIC PANEL WITHOUT GFR  BMI 33.0-33.9,adult -     Zepbound ; Inject 5 mg into the skin once a week.  Dispense: 2 mL; Refill: 2   Assessment and Plan    Weight management/obesity On tirzepatide  for weight management, experiencing expected initial weight gain. Constipation managed with Metamucil. Aware of potential increased weight loss with higher doses. - Increase tirzepatide  dose to 5 mg after current 2.5 mg course. - Continue Metamucil gummies for constipation. - Encourage increased water intake. - Provide list of high-fiber foods for 25-30 grams daily intake.  Pre-diabetes Pre-diabetic, using tirzepatide  primarily for weight loss. - Check hemoglobin A1c.  Hypothyroidism Synthroid  dosage increased, monitoring thyroid function. - Check TSH levels.  Chronic kidney disease/abnormal kidney function Monitoring kidney function with lab tests. - Order comprehensive metabolic panel.  General Health Maintenance Up to date on mammograms, approaching age for colonoscopy. Discussed importance of colon cancer screening. - Schedule GI consult for colonoscopy screening.  Follow-up Managing multiple chronic conditions, plans to adjust treatment as necessary.       Recommend healthy diet i.e mediterranean/DASH diet, consistent exercise - 30 minutes 5 day per week, and gradual weight loss.  Return in about 3 months (around 09/29/2024), or if symptoms worsen or fail to improve.   Subjective:    Patient ID: Carrie Sparks, female    DOB: 12/20/1980  Age: 43 y.o. MRN: [id number]  Chief Complaint  Patient presents with   Medical Management of Chronic Issues    HPI Discussed the use of AI scribe software for  clinical note transcription with the patient, who gave verbal consent to proceed.  History of Present Illness        Carrie Sparks is a 43 year old female who presents for follow-up of chronic medical issues and weight management.  Obesity-She has been experiencing constipation as a side effect of her current medication, Zepbound , which she has been taking for five weeks. She started using Metamucil gummies to alleviate the constipation, initially taking two gummies but reducing to one due to excessive effectiveness. She is also trying to increase her water intake to manage this symptom.  She has noticed a slight weight gain since starting [employer], which she suspects may be related to the medication. She is currently on a 2.5 mg dose. She experiences bloating and swelling, which she feels may be related to the medication. She has been increasing her fiber intake and has set reminders to drink more water.  Her exercise routine includes walking for about 20 minutes, four days a week, which she finds beneficial for her mental health. She maintains a diet focused on whole foods, with a protein shake every morning and leftover dinner for lunch.  She continues to take Trintellix, Rexulti, and Buspar for mood stabilization and reports feeling 'really good' despite stressors at work.  Her Synthroid  dosage was recently increased.  Elevated glucose/prediabetes- She also mentions that it has been more than three months since her last A1c test.  She is up to date with her mammograms and had an endoscopy last year. Patient needs colonoscopy this year.   No diabetes, but she mentions being pre-diabetic.  No significant changes in her weight according to her home scale. No anemia and she is not consuming enough water, aiming to improve this with reminders. Physical Exam Results Assessment & Plan Weight management/obesity On tirzepatide  for weight management, experiencing expected initial weight  gain. Constipation managed with Metamucil. Aware of potential increased weight loss with higher doses. - Increase tirzepatide  dose to 5 mg after current 2.5 mg course. - Continue Metamucil gummies for constipation. - Encourage increased water intake. - Provide list of high-fiber foods for 25-30 grams daily intake.  Pre-diabetes Pre-diabetic, using tirzepatide  primarily for weight loss. - Check hemoglobin A1c.  Hypothyroidism Synthroid  dosage increased, monitoring thyroid function. - Check TSH levels.  Chronic kidney disease/abnormal kidney function Monitoring kidney function with lab tests. - Order comprehensive metabolic panel.  General Health Maintenance Up to date on mammograms, approaching age for colonoscopy. Discussed importance of colon cancer screening. - Schedule GI consult for colonoscopy screening.  Follow-up Managing multiple chronic conditions, plans to adjust treatment as necessary.    The 10-year ASCVD risk score (Arnett DK, et al., 2019) is: 0.9%  Past Medical History:  Diagnosis Date   Allergy    seasonal allergies   Anxiety    GERD (gastroesophageal reflux disease)    Irritable bowel    Thyroid disease    Past Surgical History:  Procedure Laterality Date   ABLATION     uterine ablation- 2018   CESAREAN SECTION     2014 and 2017   LASIK     MOUTH SURGERY     Partial detatched retina     WISDOM TOOTH EXTRACTION     Social History   Tobacco Use   Smoking status: Never   Smokeless tobacco: Never  Vaping Use   Vaping status: Never Used  Substance Use Topics   Alcohol use: Yes    Comment: occasionally   Drug use: No   Family History  Problem Relation Age of Onset   Ulcers Mother    Osteoporosis Mother    Neurologic Disorder Mother    Osteoporosis Father    Heart Problems Father    CAD Maternal Grandfather    Allergies  Allergen Reactions   Codeine Shortness Of Breath, Itching, Nausea And Vomiting and Nausea Only   Hydrocodone  Shortness Of Breath and Nausea And Vomiting   Lactose Diarrhea    ROS    Objective:    BP 120/64   Pulse 84   Temp 98.3 F (36.8 C)   Ht 5' 4 (1.626 m)   Wt 197 lb (89.4 kg)   SpO2 98%   BMI 33.81 kg/m  BP Readings from Last 3 Encounters:  06/29/24 120/64  03/29/24 124/80  12/25/23 118/80   Wt Readings from Last 3 Encounters:  06/29/24 197 lb (89.4 kg)  03/29/24 193 lb 8 oz (87.8 kg)  12/25/23 212 lb (96.2 kg)    Physical Exam Vitals and nursing note reviewed.  Constitutional:      Appearance: Normal appearance.  HENT:     Head: Normocephalic.     Right Ear: Tympanic membrane, ear canal and external ear normal.     Left Ear: Tympanic membrane, ear canal and external ear normal.  Eyes:     Extraocular Movements: Extraocular movements intact.     Conjunctiva/sclera: Conjunctivae normal.     Pupils: Pupils are equal, round, and reactive to light.  Cardiovascular:     Rate and Rhythm: Normal rate and regular rhythm.     Heart sounds: Normal  heart sounds.  Pulmonary:     Effort: Pulmonary effort is normal.     Breath sounds: Normal breath sounds.  Musculoskeletal:     Right lower leg: No edema.     Left lower leg: No edema.  Neurological:     General: No focal deficit present.     Mental Status: She is alert and oriented to person, place, and time.  Psychiatric:        Mood and Affect: Mood normal.        Behavior: Behavior normal.        Thought Content: Thought content normal.        Judgment: Judgment normal.      No results found for any visits on 06/29/24.

## 2024-06-30 ENCOUNTER — Encounter: Payer: Self-pay | Admitting: Family Medicine

## 2024-06-30 LAB — HEMOGLOBIN A1C
Hgb A1c MFr Bld: 5.7 % — ABNORMAL HIGH (ref <5.7)
Mean Plasma Glucose: 117 mg/dL
eAG (mmol/L): 6.5 mmol/L

## 2024-06-30 LAB — COMPLETE METABOLIC PANEL WITHOUT GFR
AG Ratio: 1.6 (calc) (ref 1.0–2.5)
ALT: 15 U/L (ref 6–29)
AST: 11 U/L (ref 10–30)
Albumin: 4.3 g/dL (ref 3.6–5.1)
Alkaline phosphatase (APISO): 69 U/L (ref 31–125)
BUN: 17 mg/dL (ref 7–25)
CO2: 24 mmol/L (ref 20–32)
Calcium: 9.7 mg/dL (ref 8.6–10.2)
Chloride: 104 mmol/L (ref 98–110)
Creat: 0.96 mg/dL (ref 0.50–0.99)
Globulin: 2.7 g/dL (ref 1.9–3.7)
Glucose, Bld: 93 mg/dL (ref 65–99)
Potassium: 4.7 mmol/L (ref 3.5–5.3)
Sodium: 137 mmol/L (ref 135–146)
Total Bilirubin: 0.3 mg/dL (ref 0.2–1.2)
Total Protein: 7 g/dL (ref 6.1–8.1)

## 2024-06-30 LAB — TSH: TSH: 4.18 m[IU]/L

## 2024-06-30 NOTE — Telephone Encounter (Signed)
 Requested medications are due for refill today.  See note  Requested medications are on the active medications list.    Last refill.   Future visit scheduled.     Notes to clinic.  Pharmacy comment: Alternative Requested:NOT COVERED.    Requested Prescriptions  Pending Prescriptions Disp Refills   WEGOVY  0.25 MG/0.5ML SOAJ [Pharmacy Med Name: WEGOVY  0.25 MG/0.5 ML PEN]  0     Endocrinology:  Diabetes - GLP-1 Receptor Agonists - semaglutide  Failed - 06/30/2024  4:47 PM      Failed - HBA1C in normal range and within 180 days    Hgb A1c MFr Bld  Date Value Ref Range Status  06/29/2024 5.7 (H) <5.7 % Final    Comment:    For someone without known diabetes, a hemoglobin  A1c value between 5.7% and 6.4% is consistent with prediabetes and should be confirmed with a  follow-up test. . For someone with known diabetes, a value <7% indicates that their diabetes is well controlled. A1c targets should be individualized based on duration of diabetes, age, comorbid conditions, and other considerations. . This assay result is consistent with an increased risk of diabetes. . Currently, no consensus exists regarding use of hemoglobin A1c for diagnosis of diabetes for children. .          Passed - Cr in normal range and within 360 days    Creat  Date Value Ref Range Status  06/29/2024 0.96 0.50 - 0.99 mg/dL Final         Passed - Valid encounter within last 6 months    Recent Outpatient Visits           Yesterday Elevated glucose   [facility] Aletha Bene, MD   3 months ago Elevated glucose   [facility] Aletha Bene, MD   6 months ago Restless leg syndrome   [facility] Aletha Bene, MD

## 2024-07-01 ENCOUNTER — Ambulatory Visit: Payer: Self-pay | Admitting: Family Medicine

## 2024-07-06 ENCOUNTER — Encounter: Payer: Self-pay | Admitting: Family Medicine

## 2024-07-12 ENCOUNTER — Other Ambulatory Visit: Payer: Self-pay

## 2024-07-12 MED ORDER — WEGOVY 0.25 MG/0.5ML ~~LOC~~ SOAJ: 0.2500 mg | SUBCUTANEOUS | 0 refills | Status: DC

## 2024-08-04 ENCOUNTER — Encounter: Payer: Self-pay | Admitting: Family Medicine

## 2024-08-04 ENCOUNTER — Other Ambulatory Visit: Payer: Self-pay

## 2024-08-04 DIAGNOSIS — E7800 Pure hypercholesterolemia, unspecified: Secondary | ICD-10-CM

## 2024-08-04 DIAGNOSIS — R944 Abnormal results of kidney function studies: Secondary | ICD-10-CM

## 2024-08-04 DIAGNOSIS — Z6836 Body mass index (BMI) 36.0-36.9, adult: Secondary | ICD-10-CM

## 2024-08-04 DIAGNOSIS — E039 Hypothyroidism, unspecified: Secondary | ICD-10-CM

## 2024-08-04 DIAGNOSIS — R7309 Other abnormal glucose: Secondary | ICD-10-CM

## 2024-08-04 MED ORDER — SEMAGLUTIDE-WEIGHT MANAGEMENT 0.5 MG/0.5ML ~~LOC~~ SOAJ: 0.5000 mg | SUBCUTANEOUS | 0 refills | Status: DC

## 2024-08-18 ENCOUNTER — Other Ambulatory Visit (HOSPITAL_COMMUNITY): Payer: Self-pay

## 2024-08-30 ENCOUNTER — Other Ambulatory Visit: Payer: Self-pay | Admitting: Family Medicine

## 2024-08-30 DIAGNOSIS — R7309 Other abnormal glucose: Secondary | ICD-10-CM

## 2024-08-30 DIAGNOSIS — R944 Abnormal results of kidney function studies: Secondary | ICD-10-CM

## 2024-08-30 DIAGNOSIS — E7800 Pure hypercholesterolemia, unspecified: Secondary | ICD-10-CM

## 2024-08-30 DIAGNOSIS — Z6836 Body mass index (BMI) 36.0-36.9, adult: Secondary | ICD-10-CM

## 2024-08-30 DIAGNOSIS — E039 Hypothyroidism, unspecified: Secondary | ICD-10-CM

## 2024-08-31 ENCOUNTER — Encounter: Payer: Self-pay | Admitting: Family Medicine

## 2024-08-31 ENCOUNTER — Other Ambulatory Visit: Payer: Self-pay

## 2024-08-31 MED ORDER — LEVOTHYROXINE SODIUM 125 MCG PO TABS: 125.0000 ug | ORAL_TABLET | Freq: Every day | ORAL | 3 refills | Status: DC

## 2024-09-01 ENCOUNTER — Other Ambulatory Visit: Payer: Self-pay

## 2024-09-01 MED ORDER — SYNTHROID 125 MCG PO TABS: 125.0000 ug | ORAL_TABLET | Freq: Every day | ORAL | 3 refills | Status: DC

## 2024-09-02 ENCOUNTER — Other Ambulatory Visit: Payer: Self-pay

## 2024-09-02 ENCOUNTER — Encounter: Payer: Self-pay | Admitting: *Deleted

## 2024-09-02 MED ORDER — SYNTHROID 125 MCG PO TABS: 125.0000 ug | ORAL_TABLET | Freq: Every day | ORAL | 3 refills | Status: DC

## 2024-09-02 MED ORDER — SYNTHROID 125 MCG PO TABS: 125.0000 ug | ORAL_TABLET | Freq: Every day | ORAL | 3 refills | Status: AC

## 2024-09-27 ENCOUNTER — Encounter: Payer: Self-pay | Admitting: Family Medicine

## 2024-09-27 ENCOUNTER — Ambulatory Visit: Admitting: Family Medicine

## 2024-09-27 VITALS — BP 118/80 | HR 65 | Temp 98.1°F | Ht 64.0 in | Wt 207.5 lb

## 2024-09-27 DIAGNOSIS — Z1212 Encounter for screening for malignant neoplasm of rectum: Secondary | ICD-10-CM

## 2024-09-27 DIAGNOSIS — N289 Disorder of kidney and ureter, unspecified: Secondary | ICD-10-CM

## 2024-09-27 DIAGNOSIS — E7800 Pure hypercholesterolemia, unspecified: Secondary | ICD-10-CM | POA: Diagnosis not present

## 2024-09-27 DIAGNOSIS — Z6835 Body mass index (BMI) 35.0-35.9, adult: Secondary | ICD-10-CM | POA: Diagnosis not present

## 2024-09-27 DIAGNOSIS — Z23 Encounter for immunization: Secondary | ICD-10-CM

## 2024-09-27 DIAGNOSIS — R635 Abnormal weight gain: Secondary | ICD-10-CM | POA: Diagnosis not present

## 2024-09-27 DIAGNOSIS — Z1211 Encounter for screening for malignant neoplasm of colon: Secondary | ICD-10-CM

## 2024-09-27 MED ORDER — WEGOVY 1 MG/0.5ML ~~LOC~~ SOAJ: 1.0000 mg | SUBCUTANEOUS | 1 refills | Status: DC

## 2024-09-27 NOTE — Progress Notes (Addendum)
 Patient Office Visit  Assessment & Plan:  BMI 35.0-35.9,adult -     Wegovy ; Inject 1 mg into the skin once a week.  Dispense: 2 mL; Refill: 1  Immunization due -     Flu vaccine trivalent PF, 6mos and older(Flulaval,Afluria,Fluarix,Fluzone)  Screening for colorectal cancer -     Ambulatory referral to Gastroenterology  Elevated cholesterol -     Lipid panel  Abnormal kidney function -     Comprehensive metabolic panel with GFR   Assessment and Plan    Obesity Obesity with slow weight loss progress. Current medication effective without side effects. Maintains hydration and physical activity. - Continue current medication regimen. - Encourage increased protein intake and balanced diet with fruits and vegetables. - Advise continuation of daily walking and weekly yoga. - Recommend incorporating free weights to maintain muscle mass. - Consider increasing medication dosage to 1.7 mg if needed; she will send a message if she decides to increase the dose.  Prediabetes Prediabetes with previous A1c slightly elevated. Positive lifestyle changes reported. Insurance constraints delay A1c testing. - Check kidney function with blood work. - Postpone A1c test until insurance coverage allows (after 90 days).  Hyperlipidemia Hyperlipidemia with previous cholesterol levels slightly elevated. - Check cholesterol levels with blood work.  General Health Maintenance Due for colonoscopy at age 69. No family history of colon cancer, Crohn's, or ulcerative colitis. - Refer for colonoscopy in [place].     Recommend healthy diet i.e mediterranean/DASH diet, consistent exercise - 30 minutes 5 day per week, and gradual weight loss. Increase protein intake, starting doing free weights.  Return in about 4 months (around 01/28/2025), or if symptoms worsen or fail to improve.   Subjective:    Patient ID: Carrie Sparks, female    DOB: 1981-06-22  Age: 43 y.o. MRN: [id number]  Chief  Complaint  Patient presents with   Medical Management of Chronic Issues    HPI Discussed the use of AI scribe software for clinical note transcription with the patient, who gave verbal consent to proceed.  History of Present Illness        History of Present Illness Carrie Sparks is a 43 year old female with prediabetes who presents for a follow-up regarding weight management and prediabetes.  She is currently on medication to help curb her appetite, which she finds effective, although weight loss has been slower than previous attempts. She maintains her water intake by drinking a bottle of water during her morning carpool run. No nausea, vomiting, or constipation.  Her dietary habits include better protein intake compared to fruits and vegetables. She engages in physical activity by walking for 15 minutes daily at work and has started yoga once a week, which she enjoys. She also walks to meetings and other locations at work.  She has not yet undergone a colonoscopy, although she has reached the age milestone for it. Her last colonoscopy was 15 years ago for another reason. No family history of colon cancer, Crohn's disease, or ulcerative colitis, and no rectal bleeding.  She was previously noted to be prediabetic with a slightly elevated A1c. Her kidney function was normal at the last check. She feels she has made positive lifestyle changes and is feeling good overall.  She manages her stress by recognizing when it is affecting her and attributes her mental well-being to her current medication regimen.  Results LABS   Renal function: Normal (06/2024)  Assessment and Plan Obesity Obesity with slow weight loss progress.  Current medication effective without side effects. Maintains hydration and physical activity. - Continue current medication regimen wegovy  1 mg weekly.  - Encourage increased protein intake and balanced diet with fruits and vegetables. - Advise continuation of  daily walking and weekly yoga. - Recommend incorporating free weights to maintain muscle mass. - Consider increasing medication dosage to 1.7 mg if needed; she will send a message via mychart if she decides to increase the dose.  Prediabetes Prediabetes with previous A1c slightly elevated. Positive lifestyle changes reported. Insurance constraints delay A1c testing. - Check kidney function with blood work. - Postpone A1c test until insurance coverage allows (after 90 days).  Hyperlipidemia Hyperlipidemia with previous cholesterol levels slightly elevated. - Check cholesterol levels with blood work.  General Health Maintenance Due for colonoscopy at age 50. No family history of colon cancer, Crohn's, or ulcerative colitis. - Refer for colonoscopy in [facility]   The 10-year ASCVD risk score (Arnett DK, et al., 2019) is: 1.1%   Values used to calculate the score:     Age: 1 years     Clincally relevant sex: Female     Is Non-Hispanic African American: No     Diabetic: No     Tobacco smoker: No     Systolic Blood Pressure: 118 mmHg     Is BP treated: No     HDL Cholesterol: 48 mg/dL     Total Cholesterol: 237 mg/dL  The 89-bzjm ASCVD risk score (Arnett DK, et al., 2019) is: 1%  Past Medical History:  Diagnosis Date   Allergy    seasonal allergies   Anxiety    GERD (gastroesophageal reflux disease)    Irritable bowel    Thyroid disease    Past Surgical History:  Procedure Laterality Date   ABLATION     uterine ablation- 2018   CESAREAN SECTION     2014 and 2017   LASIK     MOUTH SURGERY     Partial detatched retina     WISDOM TOOTH EXTRACTION     Social History   Tobacco Use   Smoking status: Never   Smokeless tobacco: Never  Vaping Use   Vaping status: Never Used  Substance Use Topics   Alcohol use: Yes    Comment: occasionally   Drug use: No   Family History  Problem Relation Age of Onset   Ulcers Mother    Osteoporosis Mother     Neurologic Disorder Mother    Osteoporosis Father    Heart Problems Father    CAD Maternal Grandfather    Allergies  Allergen Reactions   Codeine Shortness Of Breath, Itching, Nausea And Vomiting and Nausea Only   Hydrocodone Shortness Of Breath and Nausea And Vomiting   Lactose Diarrhea    ROS    Objective:    BP 118/80   Pulse 65   Temp 98.1 F (36.7 C)   Ht 5' 4 (1.626 m)   Wt 207 lb 8 oz (94.1 kg)   SpO2 98%   BMI 35.62 kg/m  BP Readings from Last 3 Encounters:  09/27/24 118/80  06/29/24 120/64  03/29/24 124/80   Wt Readings from Last 3 Encounters:  09/27/24 207 lb 8 oz (94.1 kg)  06/29/24 197 lb (89.4 kg)  03/29/24 193 lb 8 oz (87.8 kg)    Physical Exam Vitals and nursing note reviewed.  Constitutional:      General: She is not in acute distress.    Appearance: Normal appearance.  HENT:     Head: Normocephalic.     Right Ear: Tympanic membrane, ear canal and external ear normal.     Left Ear: Tympanic membrane, ear canal and external ear normal.  Eyes:     Extraocular Movements: Extraocular movements intact.     Conjunctiva/sclera: Conjunctivae normal.     Pupils: Pupils are equal, round, and reactive to light.  Cardiovascular:     Rate and Rhythm: Normal rate and regular rhythm.     Heart sounds: Normal heart sounds.  Pulmonary:     Effort: Pulmonary effort is normal.     Breath sounds: Normal breath sounds.  Abdominal:     Tenderness: There is no abdominal tenderness.  Musculoskeletal:     Right lower leg: No edema.     Left lower leg: No edema.  Neurological:     General: No focal deficit present.     Mental Status: She is alert and oriented to person, place, and time.  Psychiatric:        Mood and Affect: Mood normal.        Behavior: Behavior normal.        Thought Content: Thought content normal.        Judgment: Judgment normal.      No results found for any visits on 09/27/24.

## 2024-09-28 LAB — COMPREHENSIVE METABOLIC PANEL WITH GFR
AG Ratio: 1.5 (calc) (ref 1.0–2.5)
ALT: 11 U/L (ref 6–29)
AST: 12 U/L (ref 10–35)
Albumin: 4.2 g/dL (ref 3.6–5.1)
Alkaline phosphatase (APISO): 50 U/L (ref 31–125)
BUN/Creatinine Ratio: 10 (calc) (ref 6–22)
BUN: 12 mg/dL (ref 7–25)
CO2: 26 mmol/L (ref 20–32)
Calcium: 9.4 mg/dL (ref 8.6–10.2)
Chloride: 103 mmol/L (ref 98–110)
Creat: 1.16 mg/dL — ABNORMAL HIGH (ref 0.50–0.99)
Globulin: 2.8 g/dL (ref 1.9–3.7)
Glucose, Bld: 86 mg/dL (ref 65–99)
Potassium: 4.6 mmol/L (ref 3.5–5.3)
Sodium: 137 mmol/L (ref 135–146)
Total Bilirubin: 0.3 mg/dL (ref 0.2–1.2)
Total Protein: 7 g/dL (ref 6.1–8.1)
eGFR: 59 mL/min/1.73m2 — ABNORMAL LOW (ref >=60)

## 2024-09-28 LAB — LIPID PANEL
Cholesterol: 237 mg/dL — ABNORMAL HIGH (ref <200)
HDL: 48 mg/dL — ABNORMAL LOW (ref >=50)
LDL Cholesterol (Calc): 153 mg/dL — ABNORMAL HIGH
Non-HDL Cholesterol (Calc): 189 mg/dL — ABNORMAL HIGH (ref <130)
Total CHOL/HDL Ratio: 4.9 (calc) (ref <5.0)
Triglycerides: 225 mg/dL — ABNORMAL HIGH (ref <150)

## 2024-10-01 ENCOUNTER — Other Ambulatory Visit: Payer: Self-pay | Admitting: Family Medicine

## 2024-10-10 ENCOUNTER — Ambulatory Visit: Payer: Self-pay | Admitting: Family Medicine

## 2024-11-22 ENCOUNTER — Encounter: Payer: Self-pay | Admitting: Family Medicine

## 2024-11-23 ENCOUNTER — Other Ambulatory Visit: Payer: Self-pay

## 2024-11-23 ENCOUNTER — Other Ambulatory Visit: Payer: Self-pay | Admitting: Family Medicine

## 2024-11-23 DIAGNOSIS — Z6835 Body mass index (BMI) 35.0-35.9, adult: Secondary | ICD-10-CM

## 2024-11-23 MED ORDER — SCOPOLAMINE 1 MG/3DAYS TD PT72: 1.0000 | MEDICATED_PATCH | TRANSDERMAL | 1 refills | Status: AC

## 2024-12-01 ENCOUNTER — Telehealth: Payer: Self-pay | Admitting: Pharmacy Technician

## 2024-12-01 ENCOUNTER — Other Ambulatory Visit (HOSPITAL_COMMUNITY): Payer: Self-pay

## 2024-12-01 NOTE — Telephone Encounter (Signed)
 Pharmacy Patient Advocate Encounter   Received notification from Onbase that prior authorization for Wegovy  1 mg/0.5ml injection  is required/requested.   Insurance verification completed.   The patient is insured through [facility].   Per test claim: Insurance companies are becoming increasingly stricter about requiring thorough documentation of lifestyle modifications in the patient's chart at each visit. This includes detailed records of diet recommendations (caloric intake, etc), exercise plans (amount of time/wk, etc), and an emphasis on the patient's commitment to continuing these efforts while on medication.  Without this additional documentation in the chart notes, a prior authorization will most likely be denied.  **Is her baseline weight for wegovy  in July 2025? If that is correct this PA will most likely get denied because she gained weight from 197 lbs in July to over 200 lbs in October, her current PA for wegovy  expires 12/08/24 and I was going to go ahead and renew it this morning.  Normally we have to have a weight check within the last 45 days so would you like for me to send the renewal or do you want to get her in for a new weight check?**

## 2024-12-05 ENCOUNTER — Other Ambulatory Visit (HOSPITAL_COMMUNITY): Payer: Self-pay

## 2024-12-05 NOTE — Telephone Encounter (Signed)
 CMM KEY if needed. [id number]

## 2024-12-09 ENCOUNTER — Other Ambulatory Visit (HOSPITAL_COMMUNITY): Payer: Self-pay

## 2024-12-16 ENCOUNTER — Other Ambulatory Visit (HOSPITAL_COMMUNITY): Payer: Self-pay

## 2024-12-28 ENCOUNTER — Encounter: Payer: Self-pay | Admitting: Family Medicine

## 2024-12-29 ENCOUNTER — Other Ambulatory Visit: Payer: Self-pay

## 2024-12-29 ENCOUNTER — Other Ambulatory Visit (HOSPITAL_COMMUNITY): Payer: Self-pay

## 2024-12-29 DIAGNOSIS — R7309 Other abnormal glucose: Secondary | ICD-10-CM

## 2024-12-29 DIAGNOSIS — Z6835 Body mass index (BMI) 35.0-35.9, adult: Secondary | ICD-10-CM

## 2024-12-29 DIAGNOSIS — Z6836 Body mass index (BMI) 36.0-36.9, adult: Secondary | ICD-10-CM

## 2024-12-29 DIAGNOSIS — E7800 Pure hypercholesterolemia, unspecified: Secondary | ICD-10-CM

## 2024-12-29 MED ORDER — WEGOVY 1.7 MG/0.75ML ~~LOC~~ SOAJ: 1.7000 mg | SUBCUTANEOUS | 0 refills | Status: AC

## 2025-01-03 ENCOUNTER — Other Ambulatory Visit (HOSPITAL_COMMUNITY): Payer: Self-pay

## 2025-01-11 ENCOUNTER — Other Ambulatory Visit (HOSPITAL_COMMUNITY): Payer: Self-pay

## 2025-01-11 ENCOUNTER — Encounter: Payer: Self-pay | Admitting: Family Medicine

## 2025-01-11 NOTE — Telephone Encounter (Signed)
 Hi Carrie Sparks, I sent a telephone message on 12/01/24 stating that we need updated BMI and weight (within the last 45 days) before we can submit the PA

## 2025-01-13 ENCOUNTER — Other Ambulatory Visit (HOSPITAL_COMMUNITY): Payer: Self-pay

## 2025-01-13 ENCOUNTER — Telehealth: Payer: Self-pay

## 2025-01-13 ENCOUNTER — Ambulatory Visit

## 2025-01-13 VITALS — Ht 63.0 in | Wt 196.4 lb

## 2025-01-13 DIAGNOSIS — Z789 Other specified health status: Secondary | ICD-10-CM

## 2025-01-13 NOTE — Telephone Encounter (Signed)
 Hi Kiana,   The patient came by this morning for an updated weight check. I have documented the weight and height that was needed for the prior authorization.   Thank you!

## 2025-01-13 NOTE — Telephone Encounter (Signed)
 Pharmacy Patient Advocate Encounter   Received notification from RX Request Messages that prior authorization for Wegovy  1.7mg /0.17ml is required/requested.   Insurance verification completed.   The patient is insured through [facility].   Per test claim: PA required; PA submitted to above mentioned insurance via Latent Key/confirmation #/EOC [id number] Status is pending

## 2025-01-13 NOTE — Progress Notes (Signed)
 Patient is in office today for a nurse visit for weight check. Patient was weighed and obtained an updated height for PA for Wegovy .

## 2025-01-16 NOTE — Telephone Encounter (Signed)
 Pharmacy Patient Advocate Encounter  Received notification from [facility] that Prior Authorization for Wegovy  1.7mg /0.60ml has been APPROVED from 01/14/25 to 08/13/25   PA #/Case ID/Reference #: [id number]

## 2025-02-06 ENCOUNTER — Encounter: Admitting: Family Medicine

## 2025-03-20 ENCOUNTER — Encounter
